# Patient Record
Sex: Male | Born: 1955 | Race: White | Hispanic: No | Marital: Married | Smoking: Never smoker
Health system: Southern US, Community
[De-identification: ages and names within clinical notes are randomized; demographics above are authoritative.]

## PROBLEM LIST (undated history)

## (undated) DIAGNOSIS — J45909 Unspecified asthma, uncomplicated: Secondary | ICD-10-CM

## (undated) DIAGNOSIS — K862 Cyst of pancreas: Secondary | ICD-10-CM

## (undated) DIAGNOSIS — E785 Hyperlipidemia, unspecified: Secondary | ICD-10-CM

## (undated) DIAGNOSIS — Z8249 Family history of ischemic heart disease and other diseases of the circulatory system: Secondary | ICD-10-CM

## (undated) HISTORY — DX: Unspecified asthma, uncomplicated: J45.909

---
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
## 1998-09-03 HISTORY — PX: COLONOSCOPY: SHX174

## 1999-06-02 ENCOUNTER — Ambulatory Visit (HOSPITAL_COMMUNITY): Admission: RE | Admit: 1999-06-02 | Discharge: 1999-06-02 | Payer: Self-pay | Admitting: Gastroenterology

## 2014-06-20 ENCOUNTER — Emergency Department (HOSPITAL_BASED_OUTPATIENT_CLINIC_OR_DEPARTMENT_OTHER)
Admission: EM | Admit: 2014-06-20 | Discharge: 2014-06-20 | Disposition: A | Discharge status: Home / Self Care | Payer: BC Managed Care – PPO | Attending: Emergency Medicine | Admitting: Emergency Medicine

## 2014-06-20 ENCOUNTER — Encounter (HOSPITAL_BASED_OUTPATIENT_CLINIC_OR_DEPARTMENT_OTHER): Payer: Self-pay | Admitting: Emergency Medicine

## 2014-06-20 DIAGNOSIS — W28XXXA Contact with powered lawn mower, initial encounter: Secondary | ICD-10-CM | POA: Insufficient documentation

## 2014-06-20 DIAGNOSIS — Y9389 Activity, other specified: Secondary | ICD-10-CM | POA: Insufficient documentation

## 2014-06-20 DIAGNOSIS — Y9289 Other specified places as the place of occurrence of the external cause: Secondary | ICD-10-CM | POA: Insufficient documentation

## 2014-06-20 DIAGNOSIS — S81821A Laceration with foreign body, right lower leg, initial encounter: Secondary | ICD-10-CM | POA: Insufficient documentation

## 2014-06-20 DIAGNOSIS — Z23 Encounter for immunization: Secondary | ICD-10-CM | POA: Diagnosis not present

## 2014-06-20 DIAGNOSIS — T148XXA Other injury of unspecified body region, initial encounter: Secondary | ICD-10-CM

## 2014-06-20 MED ORDER — SULFAMETHOXAZOLE-TRIMETHOPRIM 800-160 MG PO TABS: 1.0000 | ORAL_TABLET | Freq: Two times a day (BID) | ORAL | Status: AC

## 2014-06-20 MED ORDER — CEPHALEXIN 500 MG PO CAPS: 500.0000 mg | ORAL_CAPSULE | Freq: Four times a day (QID) | ORAL | Status: DC

## 2014-06-20 MED ORDER — LIDOCAINE HCL 2 % IJ SOLN
INTRAMUSCULAR | Status: AC
  Administered 2014-06-20: 20:00:00
  Filled 2014-06-20: qty 20

## 2014-06-20 MED ORDER — TETANUS-DIPHTH-ACELL PERTUSSIS 5-2.5-18.5 LF-MCG/0.5 IM SUSP
0.5000 mL | Freq: Once | INTRAMUSCULAR | Status: AC
  Administered 2014-06-20: 0.5 mL via INTRAMUSCULAR
  Filled 2014-06-20: qty 0.5

## 2014-06-20 NOTE — ED Notes (Signed)
Large splinter in right calf.

## 2014-06-20 NOTE — Discharge Instructions (Signed)
Take both antibiotics to completion. Follow up in 5-7 days for suture removal.  Sliver Removal You have had a sliver (splinter) removed. This has caused a wound that extends through some or all layers of the skin and possibly into the subcutaneous tissue. This is the tissue just beneath the skin. Because these wounds can not be cleaned well, it is necessary to watch closely for infection. AFTER THE PROCEDURE  If a cut (incision) was necessary to remove this, it may have been repaired for you by your caregiver either with suturing, stapling, or adhesive strips. These keep together the skin edges and allow better and faster healing. HOME CARE INSTRUCTIONS   A dressing may have been applied. This may be changed once per day or as instructed. If the dressing sticks, it may be soaked off with a gauze pad or clean cloth that has been dampened with soapy water or hydrogen peroxide.  It is difficult to remove all slivers or foreign bodies as they may break or splinter into smaller pieces. Be aware that your body will work to remove the foreign substance. That is, the foreign body may work itself out of the wound. That is normal.  Watch for signs of infection and notify your caregiver if you suspect a sliver or foreign body remains in the wound.  You may have received a recommendation to follow up with your physician or a specialist. It is very important to call for or keep follow-up appointments in order to avoid infection or other complications.  Only take over-the-counter or prescription medicines for pain, discomfort, or fever as directed by your caregiver.  If antibiotics were prescribed, be sure to finish all of the medicine. If you did not receive a tetanus shot today because you did not recall when your last one was given, check with your caregiver in the next day or two during follow up to determine if one is needed. SEEK MEDICAL CARE IF:   The area around the wound has new or worsening redness  or tenderness.  Pus is coming from the wound  There is a foul smell from the wound or dressing  The edges of a wound that had been repaired break open SEEK IMMEDIATE MEDICAL CARE IF:   Red streaks are coming from the wound  An unexplained oral temperature above 102 F (38.9 C) develops. Document Released: 08/17/2000 Document Revised: 11/12/2011 Document Reviewed: 04/05/2008 [facility] Patient Information 2015 [place], [place]. This information is not intended to replace advice given to you by your health care provider. Make sure you discuss any questions you have with your health care provider.  Wood Splinters Wood splinters need to be removed because they can cause skin irritation and infection. If they are close to the surface, splinters can usually be removed easily. Deep splinters may be hard to locate and need treatment by a surgeon. SPLINTER REMOVAL Removal of splinters by your caregiver is considered a surgical procedure.   The area is carefully cleaned. You may require a small amount of anesthesia (medicine injected near the splinter to numb the tissue and lessen pain). After the splinter is removed, the area will be cleaned again. A bandage is applied.  If your splinter is under a fingernail or toenail, then a small section of the nail may need to be removed. As long as the splinter did not extend to the base of the nail, the nail usually grows back normally.  A splinter that is deeper, more contaminated, or that gets near  a structure such as a bone, nerve or blood vessel may need to be removed by a Psychologist, sport and exercise.  You may need special X-rays or scans if the splinter is hard to locate.  Every attempt is made to remove the entire splinter. However, small particles may remain. Tell your caregiver if you feel that a part of the splinter was left behind. HOME CARE INSTRUCTIONS   Keep the injured area high up (elevated).  Use the injured area as little as possible.  Keep the injured  area clean and dry. Follow any directions from your caregiver.  Keep any follow-up or wound check appointments. You might need a tetanus shot now if:  You have no idea when you had the last one.  You have never had a tetanus shot before.  The injured area had dirt in it. Even if you have already removed the splinter, call your caregiver to get a tetanus shot if you need one.  If you need a tetanus shot, and you decide not to get one, there is a rare chance of getting tetanus. Sickness from tetanus can be serious. If you did get a tetanus shot, your arm may swell, get red and warm to the touch at the shot site. This is common and not a problem. SEEK MEDICAL CARE IF:   A splinter has been removed, but you are not better in a day or two.  You develop a temperature.  Signs of infection develop such as:  Redness, swelling or pus around the wound.  Red streaks spreading back from your wound towards your body. Document Released: 09/27/2004 Document Revised: 01/04/2014 Document Reviewed: 08/30/2008 [facility] Patient Information 2015 [place], [place]. This information is not intended to replace advice given to you by your health care provider. Make sure you discuss any questions you have with your health care provider.

## 2014-06-20 NOTE — ED Provider Notes (Signed)
CSN: [id number]     Arrival date & time 06/20/14  1812 History   First MD Initiated Contact with Patient 06/20/14 [place]     Chief Complaint  Patient presents with  . Foreign Body in Skin     (Consider location/radiation/quality/duration/timing/severity/associated sxs/prior Treatment) HPI Comments: This is a 58 year old male who presents to the emergency department with a splinter on the anterior aspect of his right lower leg. He reports that he was mowing the lawn this evening some wood flew into the air and got into his leg. States he tried to remove the splinter but was unable to do so. States it is only minimally painful. No aggravating or alleviating factors. Last tetanus shot was about 16 years ago.  The history is provided by the patient.    History reviewed. No pertinent past medical history. History reviewed. No pertinent past surgical history. No family history on file. History  Substance Use Topics  . Smoking status: Never Smoker   . Smokeless tobacco: Not on file  . Alcohol Use: No    Review of Systems  Constitutional: Negative.   HENT: Negative.   Gastrointestinal: Negative.   Musculoskeletal: Negative.   Skin: Positive for wound.  Neurological: Negative for numbness.      Allergies  Review of patient's allergies indicates no known allergies.  Home Medications   Prior to Admission medications   Medication Sig Start Date End Date Taking? Authorizing Provider  cephALEXin (KEFLEX) 500 MG capsule Take 1 capsule (500 mg total) by mouth 4 (four) times daily. 06/20/14   Traves Majchrzak M Rahmir Beever, PA-C  sulfamethoxazole-trimethoprim (BACTRIM DS,SEPTRA DS) 800-160 MG per tablet Take 1 tablet by mouth 2 (two) times daily. 06/20/14 06/27/14  Shilah Hefel M Spencer Cardinal, PA-C   BP 168/106  Pulse 86  Temp(Src) 98.3 F (36.8 C) (Oral)  Resp 20  SpO2 100% Physical Exam  Nursing note and vitals reviewed. Constitutional: He is oriented to person, place, and time. He appears well-developed and  well-nourished. No distress.  HENT:  Head: Normocephalic and atraumatic.  Eyes: Conjunctivae and EOM are normal.  Neck: Normal range of motion. Neck supple.  Cardiovascular: Normal rate, regular rhythm and normal heart sounds.   Pulmonary/Chest: Effort normal and breath sounds normal.  Musculoskeletal: Normal range of motion.       Legs: Neurological: He is alert and oriented to person, place, and time.  Skin: Skin is warm and dry.  Psychiatric: He has a normal mood and affect. His behavior is normal.    ED Course  FOREIGN BODY REMOVAL Date/Time: 06/20/2014 7:16 PM Performed by: Carman Ching Authorized by: Carman Ching Consent: Verbal consent obtained. Consent given by: patient Body area: skin General location: lower extremity Location details: right lower leg Anesthesia: local infiltration Local anesthetic: lidocaine 2% without epinephrine Removal mechanism: scalpel and forceps Tendon involvement: none Depth: subcutaneous Complexity: simple 1 objects recovered. Post-procedure assessment: foreign body removed   (including critical care time) LACERATION REPAIR Performed by: Lucien Mons Authorized by: Lucien Mons Consent: Verbal consent obtained. Risks and benefits: risks, benefits and alternatives were discussed Consent given by: patient Patient identity confirmed: provided demographic data Prepped and Draped in normal sterile fashion Wound explored  Laceration Location: right lower leg  Laceration Length: 1cm  No Foreign Bodies seen or palpated  Anesthesia: local infiltration  Local anesthetic: lidocaine 2% without epinephrine  Anesthetic total: 1 ml  Irrigation method: syringe Amount of cleaning: standard  Skin closure: 4-0 prolene  Number of sutures: 1  Technique:  simple interrupted  Patient tolerance: Patient tolerated the procedure well with no immediate complications.  Labs Review Labs Reviewed - No data to display  Imaging Review No  results found.   EKG Interpretation None      MDM   Final diagnoses:  Foreign body in skin   Patient nontoxic appearing and in no apparent distress. Neurovascularly intact. Foreign body removed with scalpel and forceps. One suture closed incision loosely. Tetanus updated. Will start patient on Bactrim and Keflex. Stable for discharge. Return precautions given. Patient states understanding of treatment care plan and is agreeable.  Case discussed with attending Dr. Johnney Killian who agrees with plan of care.   Carman Ching, PA-C 06/20/14 [phone]

## 2014-06-21 NOTE — ED Provider Notes (Signed)
Medical screening examination/treatment/procedure(s) were performed by non-physician practitioner and as supervising physician I was immediately available for consultation/collaboration.   EKG Interpretation None       Charlesetta Shanks, MD 06/21/14 0001

## 2017-11-24 DIAGNOSIS — R55 Syncope and collapse: Secondary | ICD-10-CM | POA: Insufficient documentation

## 2019-12-18 DIAGNOSIS — N401 Enlarged prostate with lower urinary tract symptoms: Secondary | ICD-10-CM | POA: Diagnosis not present

## 2019-12-18 DIAGNOSIS — E785 Hyperlipidemia, unspecified: Secondary | ICD-10-CM | POA: Diagnosis not present

## 2019-12-18 DIAGNOSIS — Z0000 Encounter for general adult medical examination without abnormal findings: Secondary | ICD-10-CM | POA: Diagnosis not present

## 2019-12-18 DIAGNOSIS — R030 Elevated blood-pressure reading, without diagnosis of hypertension: Secondary | ICD-10-CM | POA: Diagnosis not present

## 2019-12-18 DIAGNOSIS — J4520 Mild intermittent asthma, uncomplicated: Secondary | ICD-10-CM | POA: Diagnosis not present

## 2019-12-18 DIAGNOSIS — L989 Disorder of the skin and subcutaneous tissue, unspecified: Secondary | ICD-10-CM | POA: Diagnosis not present

## 2019-12-18 DIAGNOSIS — Z125 Encounter for screening for malignant neoplasm of prostate: Secondary | ICD-10-CM | POA: Diagnosis not present

## 2019-12-25 DIAGNOSIS — Z1211 Encounter for screening for malignant neoplasm of colon: Secondary | ICD-10-CM | POA: Diagnosis not present

## 2019-12-31 DIAGNOSIS — R7309 Other abnormal glucose: Secondary | ICD-10-CM | POA: Diagnosis not present

## 2019-12-31 DIAGNOSIS — R7401 Elevation of levels of liver transaminase levels: Secondary | ICD-10-CM | POA: Diagnosis not present

## 2020-01-08 DIAGNOSIS — H01004 Unspecified blepharitis left upper eyelid: Secondary | ICD-10-CM | POA: Diagnosis not present

## 2020-01-08 DIAGNOSIS — H01001 Unspecified blepharitis right upper eyelid: Secondary | ICD-10-CM | POA: Diagnosis not present

## 2020-01-08 DIAGNOSIS — H04123 Dry eye syndrome of bilateral lacrimal glands: Secondary | ICD-10-CM | POA: Diagnosis not present

## 2020-01-08 DIAGNOSIS — H2513 Age-related nuclear cataract, bilateral: Secondary | ICD-10-CM | POA: Diagnosis not present

## 2020-01-13 DIAGNOSIS — R7989 Other specified abnormal findings of blood chemistry: Secondary | ICD-10-CM | POA: Diagnosis not present

## 2020-01-15 ENCOUNTER — Other Ambulatory Visit: Payer: Self-pay | Admitting: Family Medicine

## 2020-01-15 DIAGNOSIS — K769 Liver disease, unspecified: Secondary | ICD-10-CM

## 2020-01-15 DIAGNOSIS — R932 Abnormal findings on diagnostic imaging of liver and biliary tract: Secondary | ICD-10-CM

## 2020-01-16 ENCOUNTER — Other Ambulatory Visit: Payer: Self-pay

## 2020-01-16 ENCOUNTER — Ambulatory Visit
Admission: RE | Admit: 2020-01-16 | Discharge: 2020-01-16 | Disposition: A | Discharge status: Home / Self Care | Payer: Medicare Other | Source: Ambulatory Visit | Attending: Family Medicine | Admitting: Family Medicine

## 2020-01-16 DIAGNOSIS — R932 Abnormal findings on diagnostic imaging of liver and biliary tract: Secondary | ICD-10-CM

## 2020-01-16 DIAGNOSIS — K769 Liver disease, unspecified: Secondary | ICD-10-CM

## 2020-01-16 IMAGING — MR MR ABDOMEN WO/W CM
17 series · 48 of 48 positions shown · IV contrast (multihance)
Comparison: None.

CLINICAL DATA: Evaluate liver lesion identified on recent
ultrasound. Elevated liver function test.

EXAM:
MRI ABDOMEN WITHOUT AND WITH CONTRAST
TECHNIQUE: Multiplanar multisequence MR imaging of the abdomen was performed
both before and after the administration of intravenous contrast.
CONTRAST:  17mL MULTIHANCE GADOBENATE DIMEGLUMINE 529 MG/ML IV SOLN

[Series 4: T2 · coronal · 5.0mm · 1.56mm/px · 1 of 36 slices shown (1 of 3)]
[im 1/36]
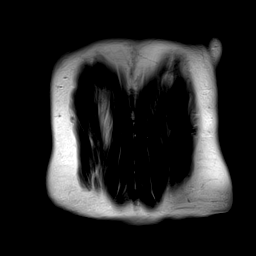

[Series 5: T1 · axial · 3.0mm · 1.19mm/px · z∈[-165,+48]mm · 5 of 144 slices shown]
[im 1/144]
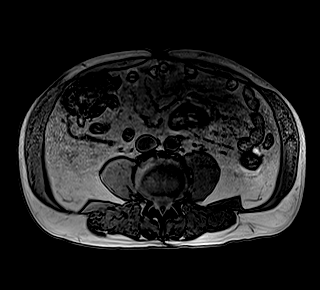
[im 36/144]
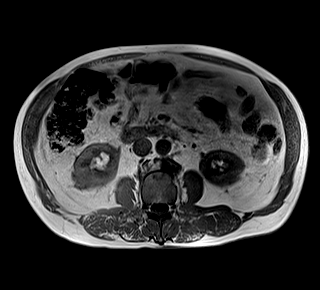
[im 72/144]
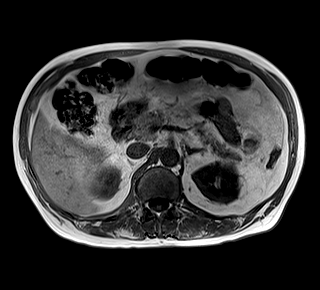
[im 108/144]
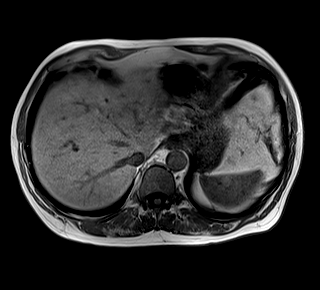
[im 144/144]
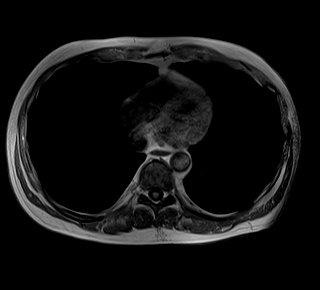

[Series 6: T2 · axial · 5.0mm · 1.48mm/px · z∈[-119,+103]mm · 2 of 38 slices shown (2 of 3)]
[im 1/38]
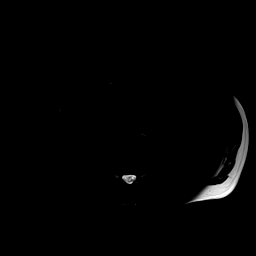
[im 38/38]
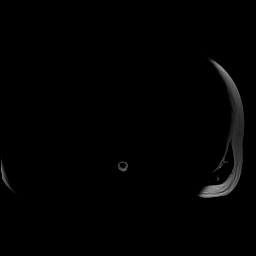

[Series 7: DWI · axial · 5.0mm · 1.42mm/px · z∈[-113,+97]mm · 5 of 108 slices shown (1 of 2)]
[im 1/108]
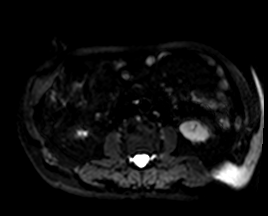
[im 27/108]
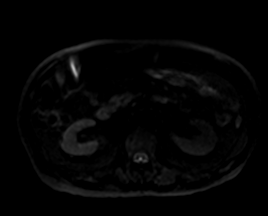
[im 54/108]
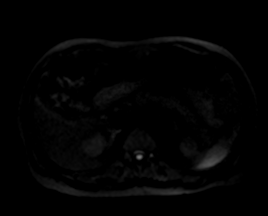
[im 81/108]
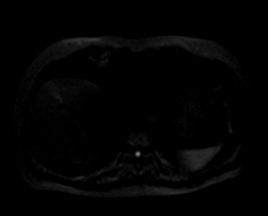
[im 108/108]
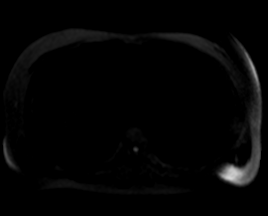

[Series 8: DWI · axial · 5.0mm · 1.42mm/px · z∈[-113,+97]mm · 2 of 36 slices shown (2 of 2)]
[im 1/36]
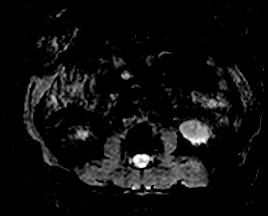
[im 36/36]
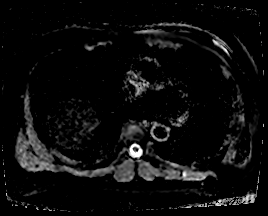

[Series 9: T2 · axial · 6.0mm · 1.19mm/px · 1 of 30 slices shown (3 of 3)]
[im 1/30]
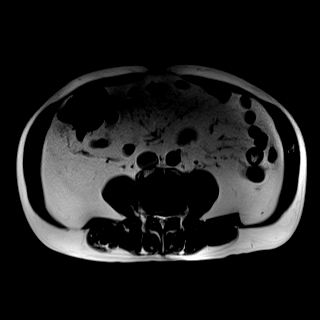

[Series 10: bSSFP · axial · 5.0mm · 1.25mm/px · z∈[-169,+53]mm · 2 of 38 slices shown]
[im 1/38]
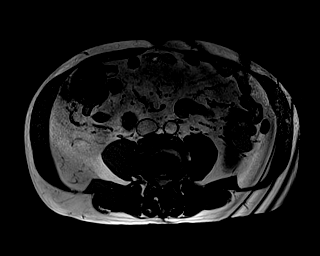
[im 38/38]
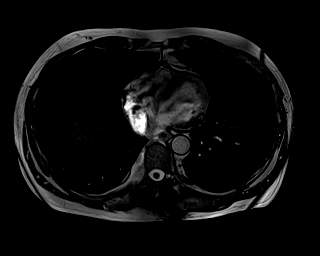

[Series 11: T1 dynamic · axial · non-contrast · 3.0mm · 1.25mm/px · z∈[-165,+48]mm · 3 of 72 slices shown]
[im 1/72]
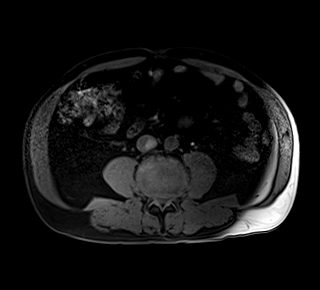
[im 36/72]
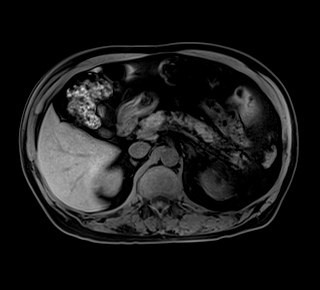
[im 72/72]
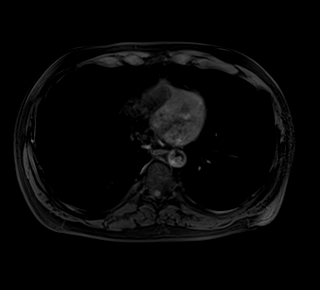

[Series 12: T1 dynamic post-contrast · axial · 3.0mm · 1.25mm/px · z∈[-165,+48]mm · 3 of 72 slices shown (1 of 9)]
[im 1/72]
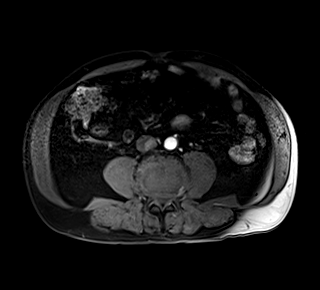
[im 36/72]
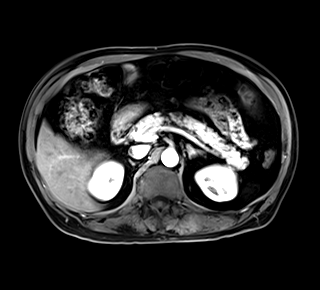
[im 72/72]
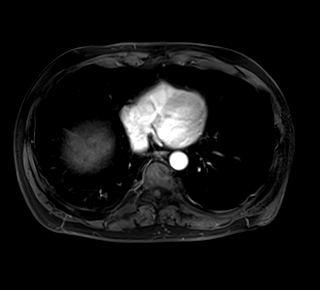

[Series 13: T1 dynamic post-contrast · axial · 3.0mm · 1.25mm/px · z∈[-165,+48]mm · 3 of 72 slices shown (2 of 9)]
[im 1/72]
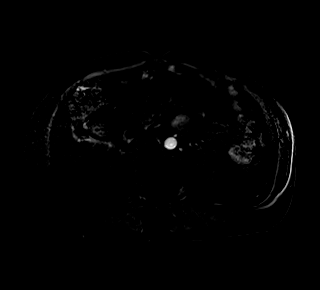
[im 36/72]
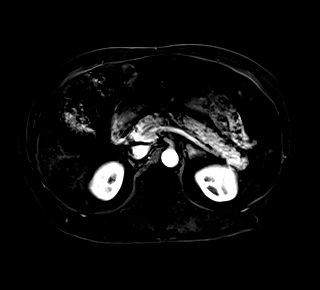
[im 72/72]
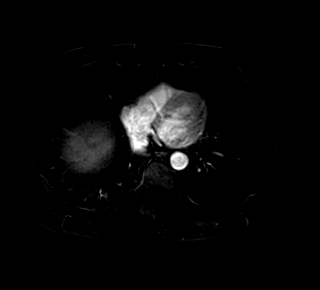

[Series 14: T1 dynamic post-contrast · axial · 3.0mm · 1.25mm/px · z∈[-165,+48]mm · 3 of 72 slices shown (3 of 9)]
[im 1/72]
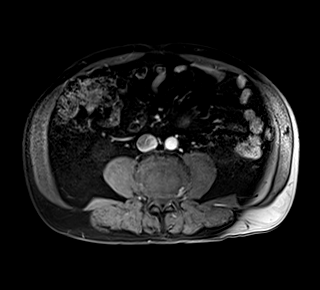
[im 36/72]
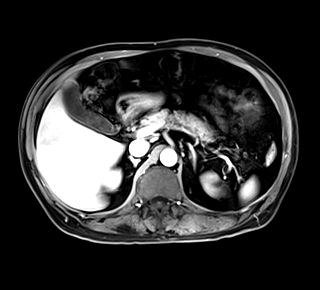
[im 72/72]
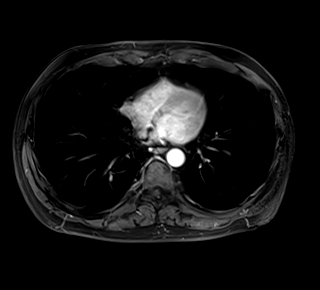

[Series 15: T1 dynamic post-contrast · axial · 3.0mm · 1.25mm/px · z∈[-165,+48]mm · 3 of 72 slices shown (4 of 9)]
[im 1/72]
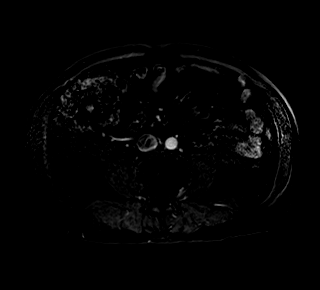
[im 36/72]
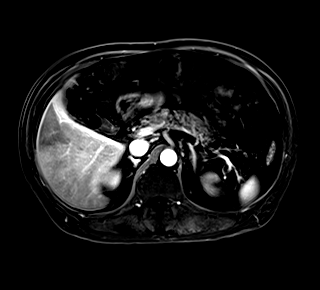
[im 72/72]
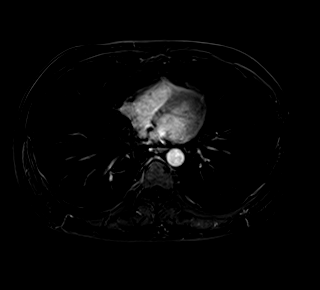

[Series 16: T1 dynamic post-contrast · axial · 3.0mm · 1.25mm/px · z∈[-165,+48]mm · 3 of 72 slices shown (5 of 9)]
[im 1/72]
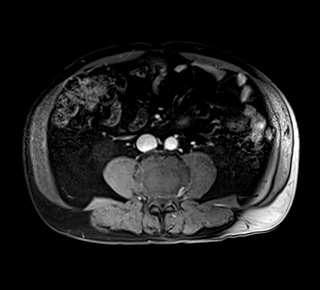
[im 36/72]
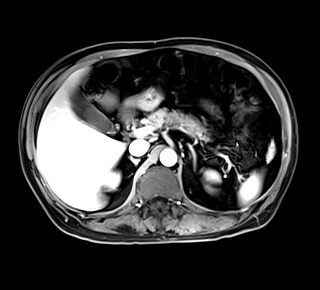
[im 72/72]
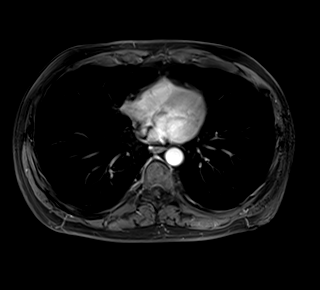

[Series 17: T1 dynamic post-contrast · axial · 3.0mm · 1.25mm/px · z∈[-165,+48]mm · 3 of 72 slices shown (6 of 9)]
[im 1/72]
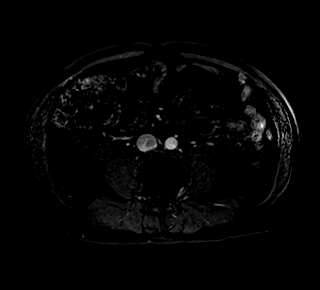
[im 36/72]
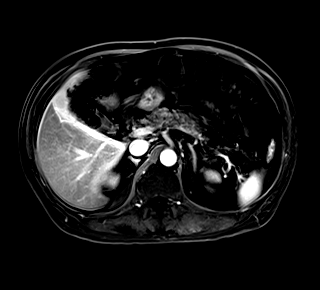
[im 72/72]
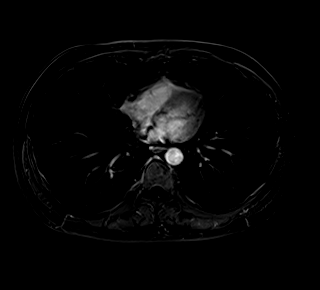

[Series 18: T1 dynamic post-contrast · coronal · 3.0mm · 1.25mm/px · 3 of 72 slices shown (7 of 9)]
[im 1/72]
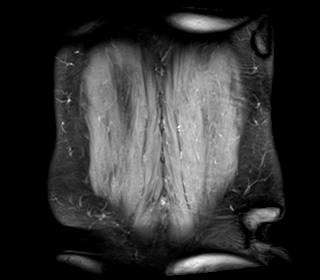
[im 36/72]
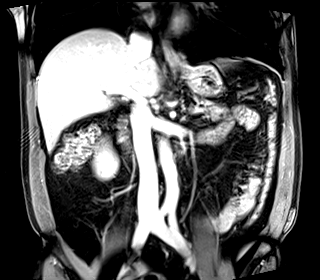
[im 72/72]
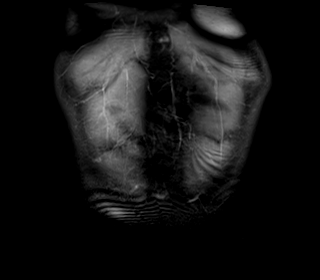

[Series 19: T1 dynamic post-contrast · axial · 3.0mm · 1.25mm/px · z∈[-165,+48]mm · 3 of 72 slices shown (8 of 9)]
[im 1/72]
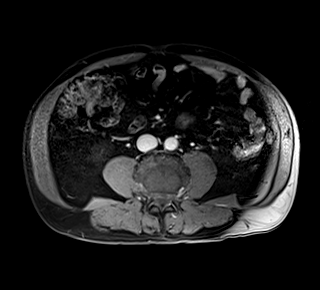
[im 36/72]
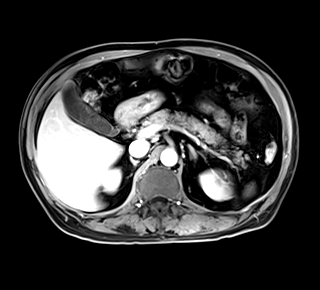
[im 72/72]
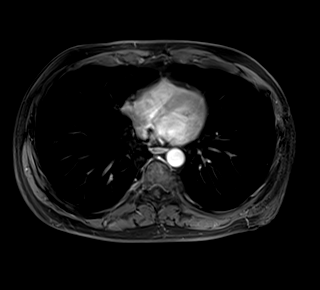

[Series 20: T1 dynamic post-contrast · axial · 3.0mm · 1.25mm/px · z∈[-165,+48]mm · 3 of 72 slices shown (9 of 9)]
[im 1/72]
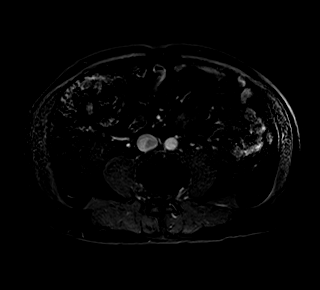
[im 36/72]
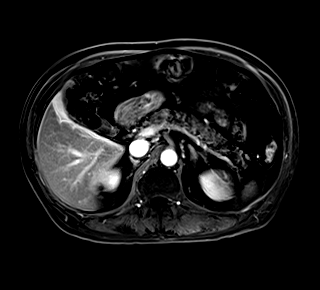
[im 72/72]
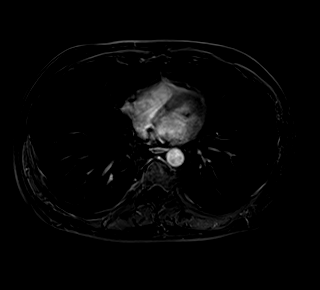

[48 of 48 positions shown; findings below may reference images not displayed]

FINDINGS: Lower chest: No acute findings.

Hepatobiliary: Hepatic steatosis. No suspicious liver lesion. The
gallbladder appears normal. No gallstones, gallbladder wall
thickening or inflammation. No bile duct dilatation.

Pancreas: No main duct dilatation or inflammation noted. Tiny, 5 mm
unilocular cystic structure identified within the pancreatic body,
image [DATE].

Spleen:  Within normal limits in size and appearance.

Adrenals/Urinary Tract: Normal appearance of the adrenal glands. No
kidney mass or hydronephrosis identified.

Stomach/Bowel: Visualized portions within the abdomen are
unremarkable.

Vascular/Lymphatic: No pathologically enlarged lymph nodes
identified. No abdominal aortic aneurysm demonstrated.

Other:  None.

Musculoskeletal: No suspicious bone lesions identified.
IMPRESSION: 1. Hepatic steatosis.
2. No suspicious liver lesions identified.
3. Tiny cystic lesion noted within body of pancreas measures 5 mm.
This has a nonaggressive appearance. Follow-up imaging in 24 months
with pancreas protocol abdominal MRI without and with contrast
material is recommended. This recommendation follows ACR consensus
guidelines: Management of Incidental Pancreatic Cysts: A White Paper
of the ACR Incidental Findings Committee. [HOSPITAL]

## 2020-01-16 MED ORDER — GADOBENATE DIMEGLUMINE 529 MG/ML IV SOLN
17.0000 mL | Freq: Once | INTRAVENOUS | Status: AC | PRN
  Administered 2020-01-16: 17 mL via INTRAVENOUS

## 2022-01-05 ENCOUNTER — Other Ambulatory Visit: Payer: Self-pay | Admitting: Family Medicine

## 2022-01-05 DIAGNOSIS — K869 Disease of pancreas, unspecified: Secondary | ICD-10-CM

## 2022-01-05 DIAGNOSIS — E785 Hyperlipidemia, unspecified: Secondary | ICD-10-CM

## 2022-01-13 ENCOUNTER — Ambulatory Visit
Admission: RE | Admit: 2022-01-13 | Discharge: 2022-01-13 | Disposition: A | Discharge status: Home / Self Care | Payer: Medicare Other | Source: Ambulatory Visit | Attending: Family Medicine | Admitting: Family Medicine

## 2022-01-13 DIAGNOSIS — K869 Disease of pancreas, unspecified: Secondary | ICD-10-CM

## 2022-01-13 IMAGING — MR MR ABDOMEN WO/W CM
14 of 23 series · 26 of 48 positions shown · IV contrast (multihance)
Comparison: Abdominal MRI [DATE].

CLINICAL DATA: 66-year-old male with history of pancreatic lesion.
Follow-up study.

EXAM:
MRI ABDOMEN WITHOUT AND WITH CONTRAST
TECHNIQUE: Multiplanar multisequence MR imaging of the abdomen was performed
both before and after the administration of intravenous contrast.
CONTRAST:  16mL MULTIHANCE GADOBENATE DIMEGLUMINE 529 MG/ML IV SOLN

[Series 3: T2 · coronal · 5.0mm · 1.56mm/px · 1 of 37 slices shown (1 of 5)]
[im 1/37]
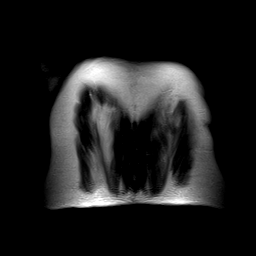

[Series 4: T2 · axial · 6.0mm · 1.48mm/px · 1 of 42 slices shown (2 of 5)]
[im 1/42]
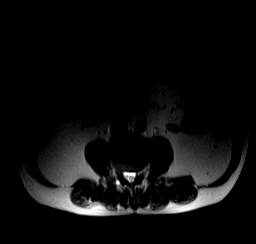

[Series 6: axial in out · axial · 5.5mm · 0.74mm/px · z∈[-167,+130]mm · 2 of 92 slices shown]
[im 1/92]
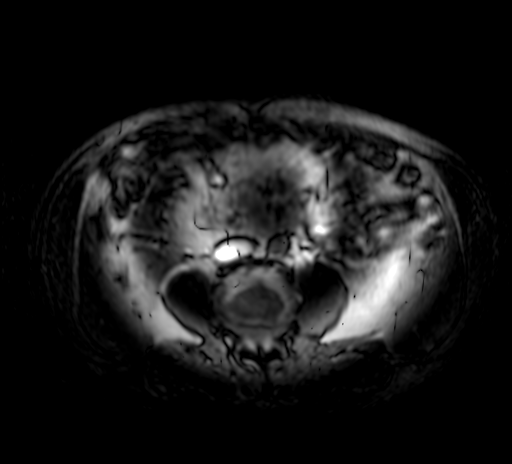
[im 92/92]
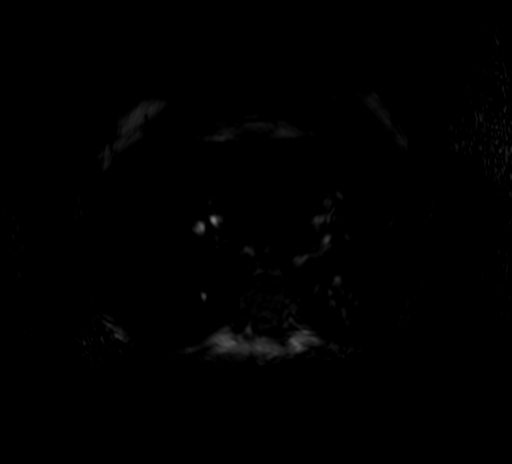

[Series 7: axial tru fisp · axial · 5.0mm · 1.48mm/px · 1 of 48 slices shown]
[im 1/48]
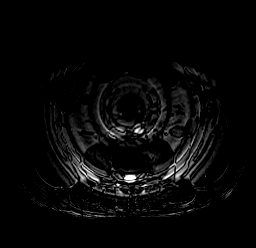

[Series 11: T2 · coronal · 3.5mm · 1.48mm/px · 1 of 55 slices shown (3 of 5)]
[im 1/55]
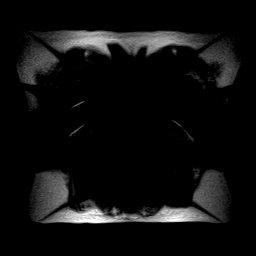

[Series 12: T2 · axial · 6.0mm · 0.78mm/px · 1 of 43 slices shown (4 of 5)]
[im 1/43]
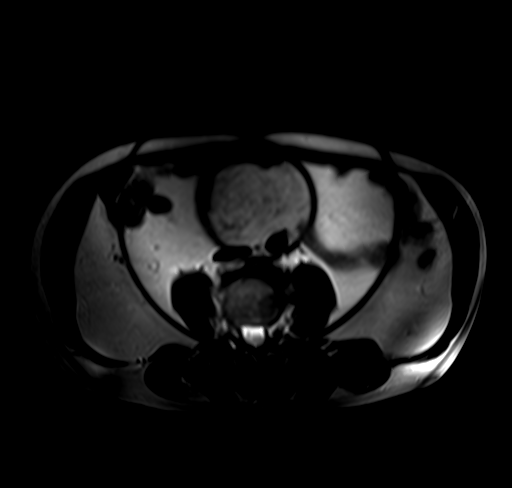

[Series 13: ep2d_[id number]_500_800_p2 · axial · 6.0mm · 1.98mm/px · z∈[-148,+142]mm · 3 of 129 slices shown]
[im 1/129]
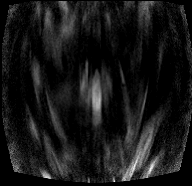
[im 65/129]
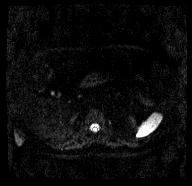
[im 129/129]
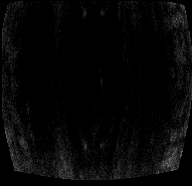

[Series 14: [id number]_[id number]_adc · axial · 6.0mm · 1.98mm/px · 1 of 43 slices shown]
[im 1/43]
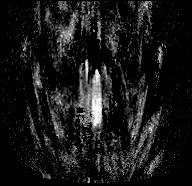

[Series 17: T2 · coronal · 3.0mm · 1.48mm/px · 1 of 39 slices shown (5 of 5)]
[im 1/39]
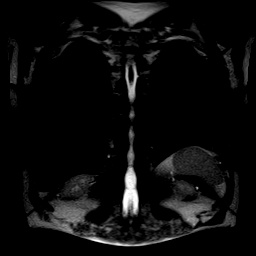

[Series 19: T1 dynamic · axial · non-contrast · 3.0mm · 0.74mm/px · z∈[-167,+118]mm · 2 of 96 slices shown]
[im 1/96]
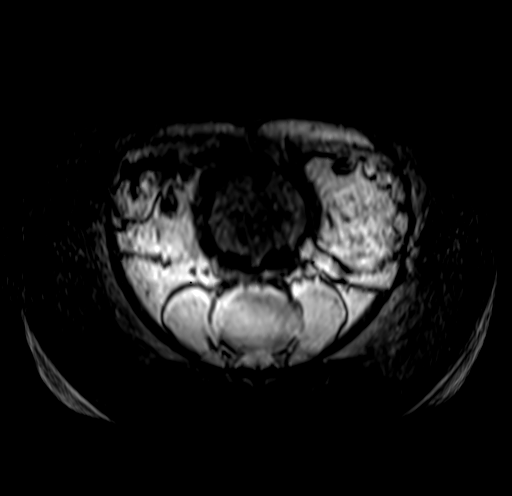
[im 96/96]
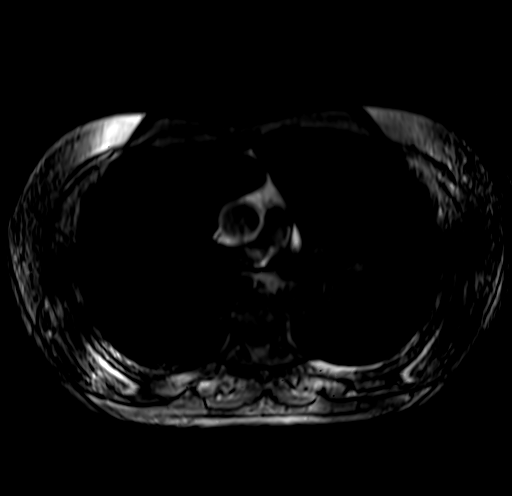

[Series 20: post 30 sec · axial · 3.0mm · 0.74mm/px · z∈[-167,+118]mm · 3 of 96 slices shown]
[im 1/96]
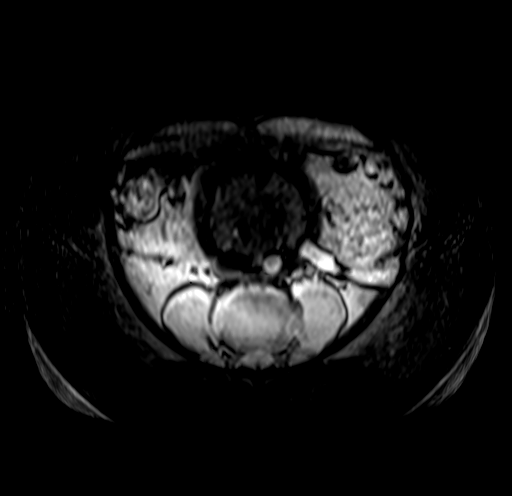
[im 48/96]
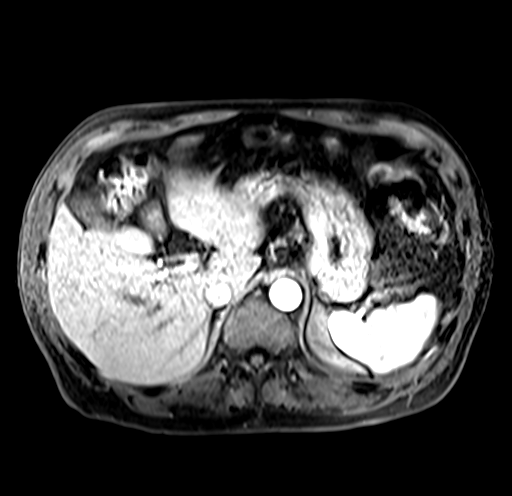
[im 96/96]
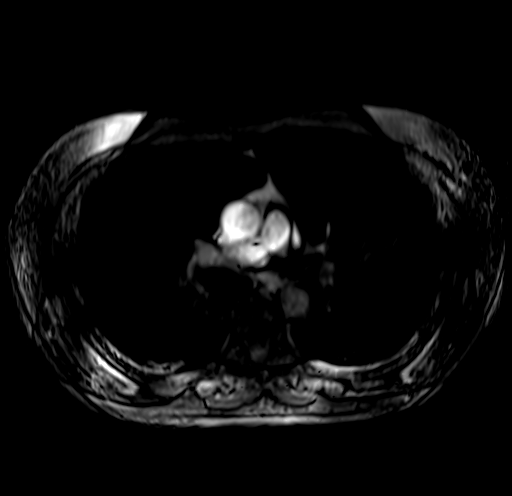

[Series 21: post 30 sec_sub · axial · 3.0mm · 0.74mm/px · z∈[-167,+118]mm · 3 of 96 slices shown]
[im 1/96]
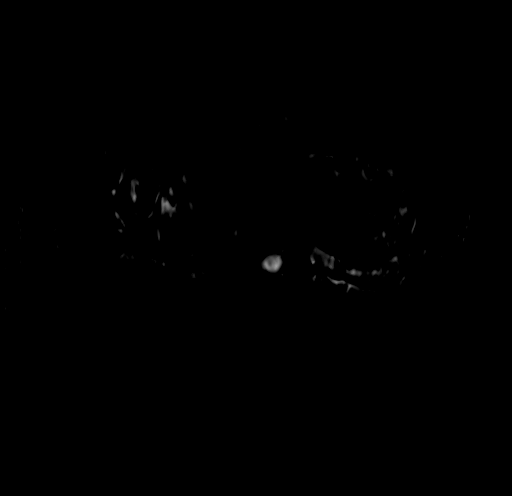
[im 48/96]
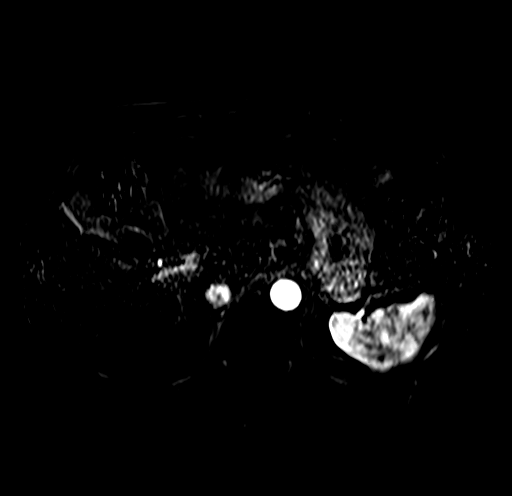
[im 96/96]
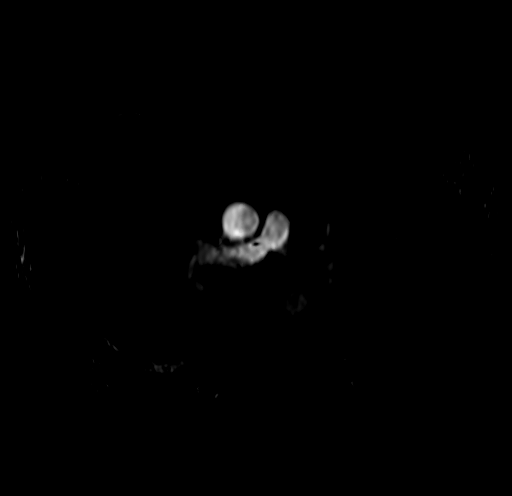

[Series 22: post 60 sec · axial · 3.0mm · 0.74mm/px · z∈[-167,+118]mm · 3 of 96 slices shown]
[im 1/96]
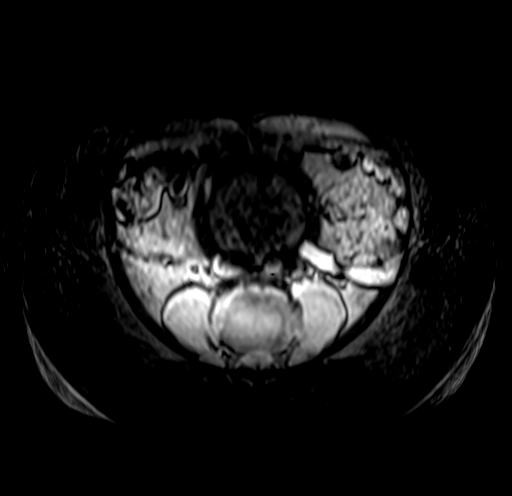
[im 48/96]
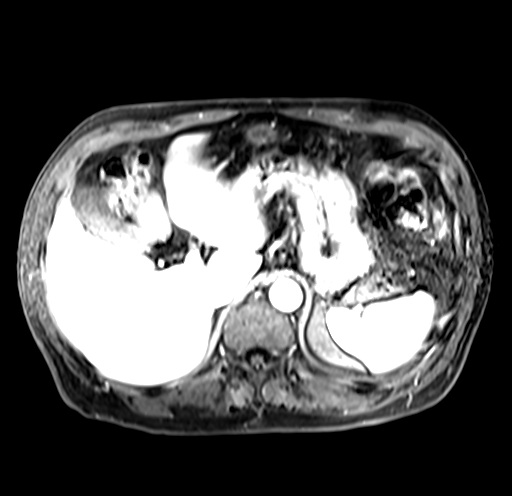
[im 96/96]
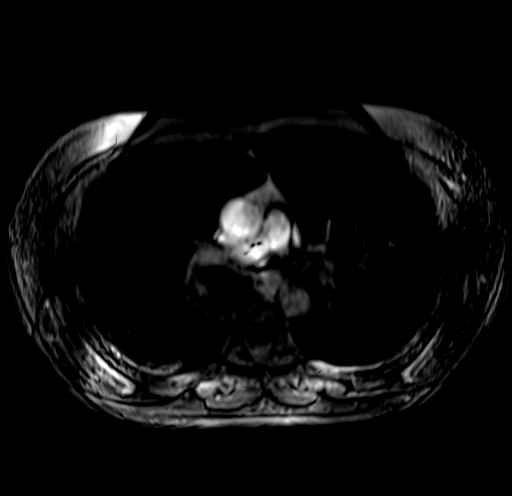

[Series 23: post 60 sec_sub · axial · 3.0mm · 0.74mm/px · z∈[-167,+118]mm · 3 of 96 slices shown]
[im 1/96]
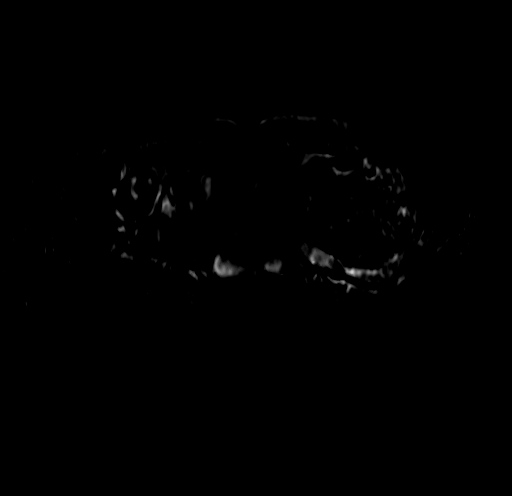
[im 48/96]
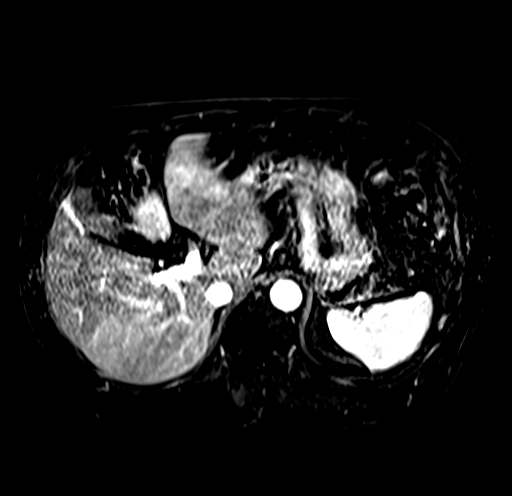
[im 96/96]
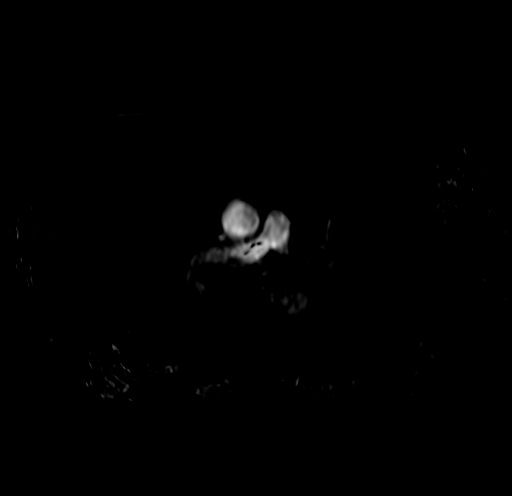

[26 of 48 positions shown; findings below may reference images not displayed]

FINDINGS: Lower chest: Unremarkable.

Hepatobiliary: No suspicious cystic or solid hepatic lesions. No
intra or extrahepatic biliary ductal dilatation. There is some
amorphous T1 hyperintense material which is slightly T2 hypointense
lying dependently in the gallbladder, compatible with a small amount
of biliary sludge. Gallbladder is not distended. Gallbladder wall
thickness is normal. No pericholecystic fluid or surrounding
inflammatory changes.

Pancreas: The previously described small lesion in the mid body of
the pancreas is slightly larger than the prior study (axial image 26
of series 4, and coronal image 31 of series 11)) measuring 8 x 5 x
15 mm on today's examination, but remains T1 hypointense and T2
hyperintense without definite internal enhancement on post
gadolinium imaging. This comes in close proximity to the main
pancreatic duct on MRCP images such that direct communication is not
excluded. Main pancreatic duct is normal in caliber. No other solid
appearing pancreatic mass identified. No peripancreatic fluid
collections or inflammatory changes.

Spleen:  Unremarkable.

Adrenals/Urinary Tract: Bilateral kidneys and adrenal glands are
normal in appearance. No hydroureteronephrosis in the visualized
portions of the abdomen.

Stomach/Bowel: Visualized portions are unremarkable.

Vascular/Lymphatic: No aneurysm identified in the visualized
abdominal vasculature. No lymphadenopathy noted in the abdomen.

Other: No significant volume of ascites noted in the visualized
portions of the peritoneal cavity.

Musculoskeletal: No aggressive appearing osseous lesions are noted
in the visualized portions of the skeleton.
IMPRESSION: 1. The lesion of concern in the mid body of the pancreas has grown
compared to the prior study, and may communicate with the main
pancreatic duct. Findings are suspicious for small side branch IPMN
(intraductal papillary mucinous neoplasm). Repeat abdominal MRI with
and without IV gadolinium with MRCP is recommended in 6 months to
re-evaluate this finding. This recommendation follows ACR consensus
guidelines: Management of Incidental Pancreatic Cysts: A White Paper
of the ACR Incidental Findings Committee. [HOSPITAL]

## 2022-01-13 MED ORDER — GADOBENATE DIMEGLUMINE 529 MG/ML IV SOLN
16.0000 mL | Freq: Once | INTRAVENOUS | Status: AC | PRN
  Administered 2022-01-13: 16 mL via INTRAVENOUS

## 2022-02-01 ENCOUNTER — Ambulatory Visit
Admission: RE | Admit: 2022-02-01 | Discharge: 2022-02-01 | Disposition: A | Discharge status: Home / Self Care | Payer: No Typology Code available for payment source | Source: Ambulatory Visit | Attending: Family Medicine | Admitting: Family Medicine

## 2022-02-01 DIAGNOSIS — E785 Hyperlipidemia, unspecified: Secondary | ICD-10-CM

## 2022-02-01 IMAGING — CT CT CARDIAC CORONARY ARTERY CALCIUM SCORE
3 series · 14 of 20 positions shown, 16 images · non-contrast
Comparison: None Available.

CLINICAL DATA: Hyperlipidemia

EXAM:
CT CARDIAC CORONARY ARTERY CALCIUM SCORE
TECHNIQUE: Non-contrast imaging through the heart was performed using
prospective ECG gating. Image post processing was performed on an
independent workstation, allowing for quantitative analysis of the
heart and coronary arteries. Note that this exam targets the heart
and the chest was not imaged in its entirety.

[Series 2: calcium scoring 2.00 qr36 bestdiast 70% hrt calciu · axial · 0.36mm/px · z∈[+1518,+1620]mm · 4 of 86 slices shown]
[im 18/86  vessel]
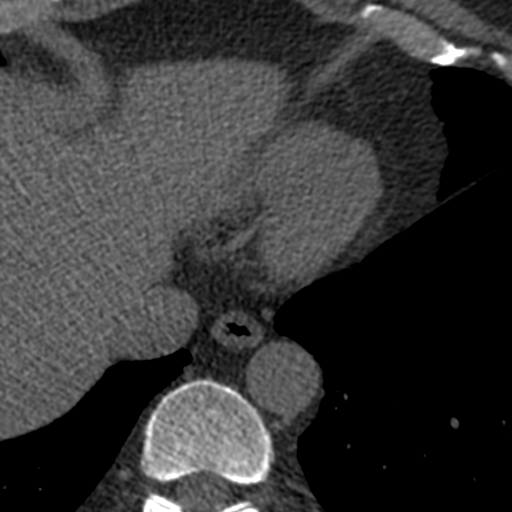
[im 35/86  vessel]
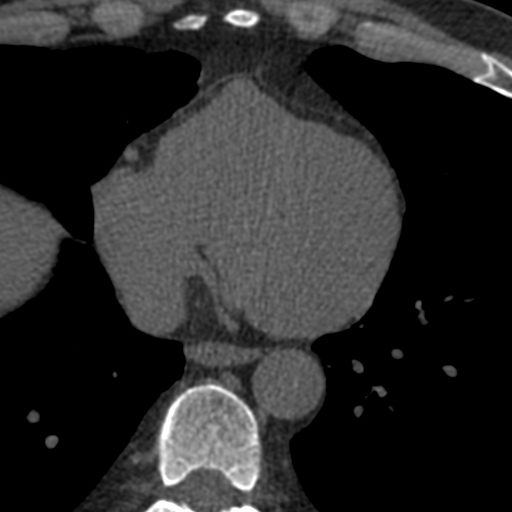
[im 52/86  vessel]
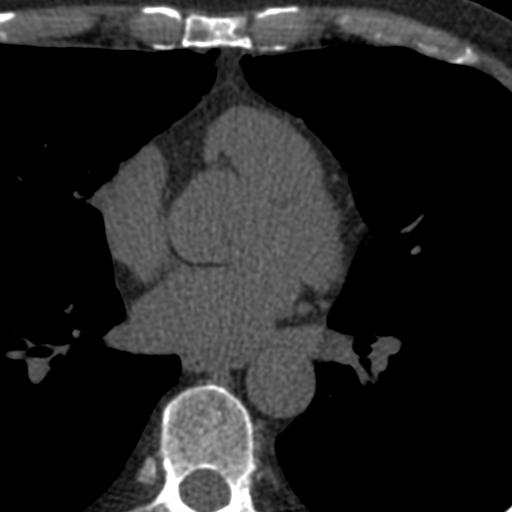
[im 69/86  vessel]
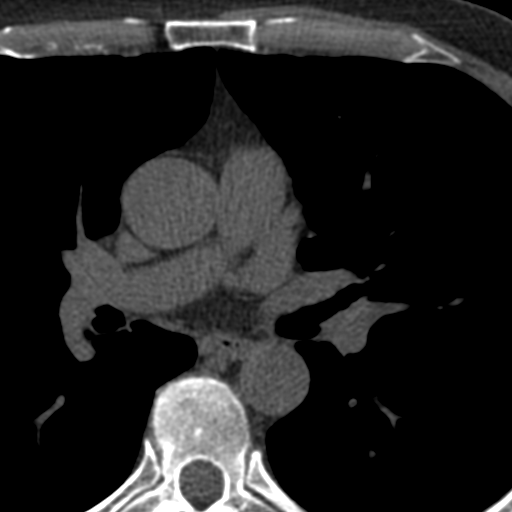

[Series 3: calcium scoring 2.00 br40 bestdiast 70% axial · axial · 0.57mm/px · z∈[+1512,+1624]mm · 5 of 86 slices shown, 7 images]
[im 15/86  vessel]
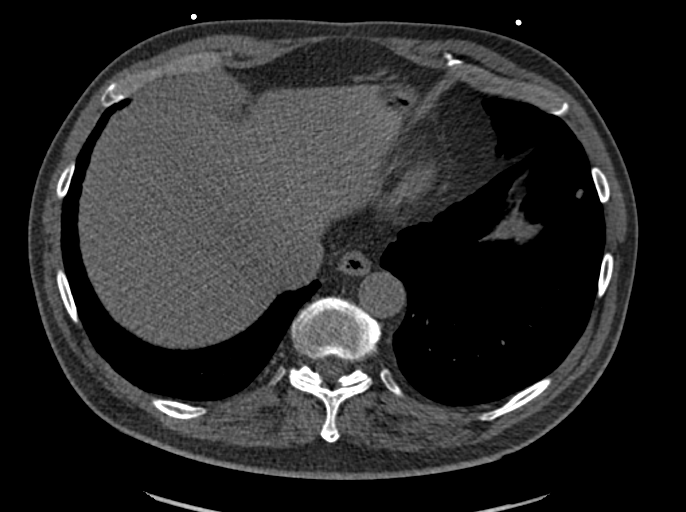
[im 15/86  lung]
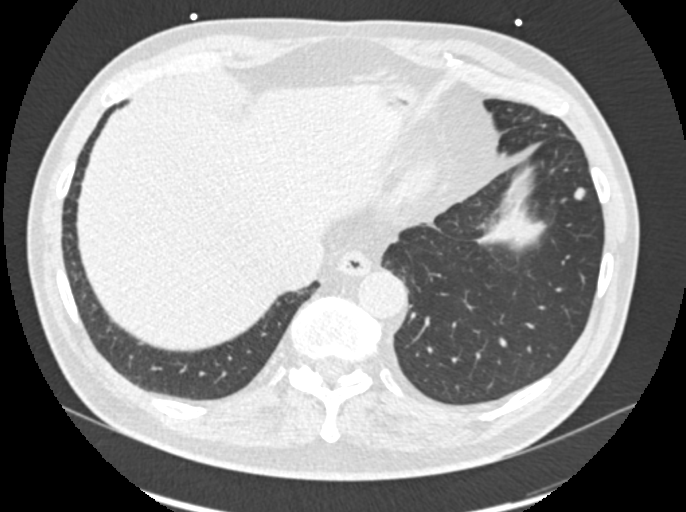
[im 29/86  vessel]
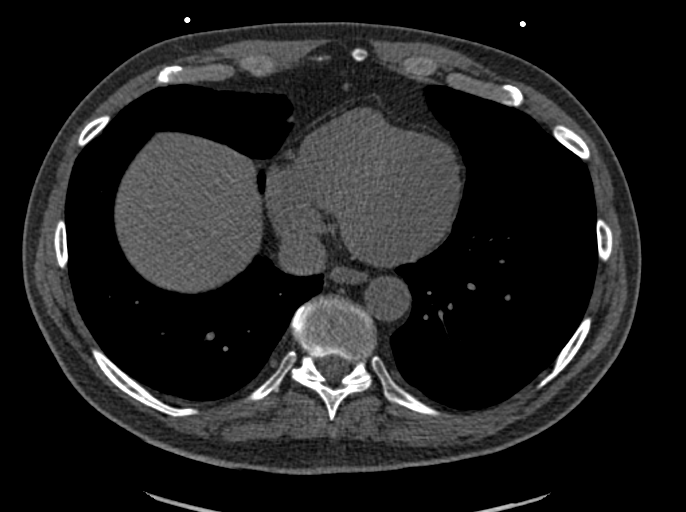
[im 43/86  vessel]
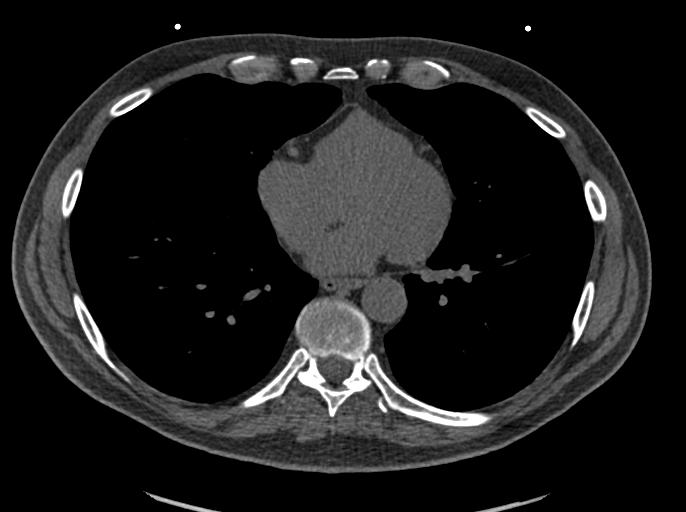
[im 57/86  vessel]
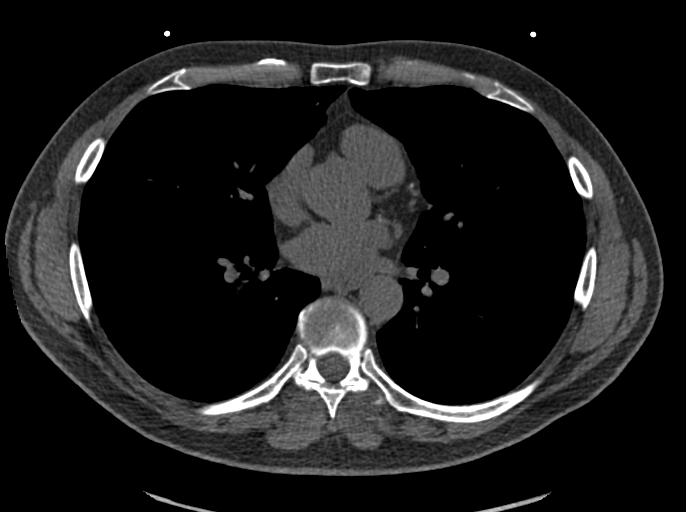
[im 71/86  vessel]
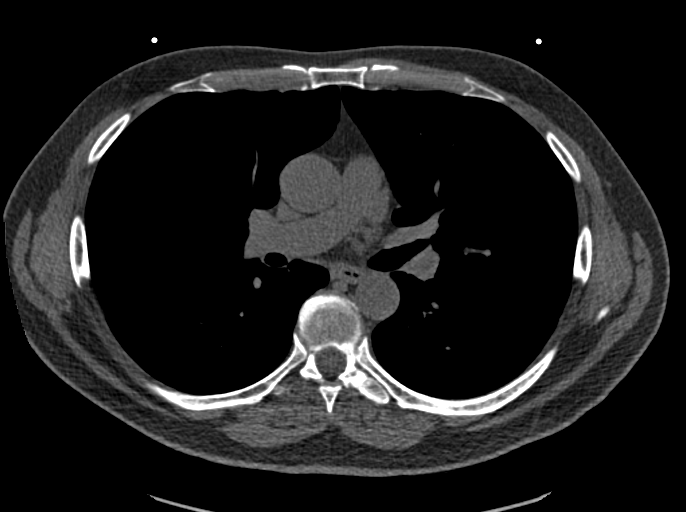
[im 71/86  lung]
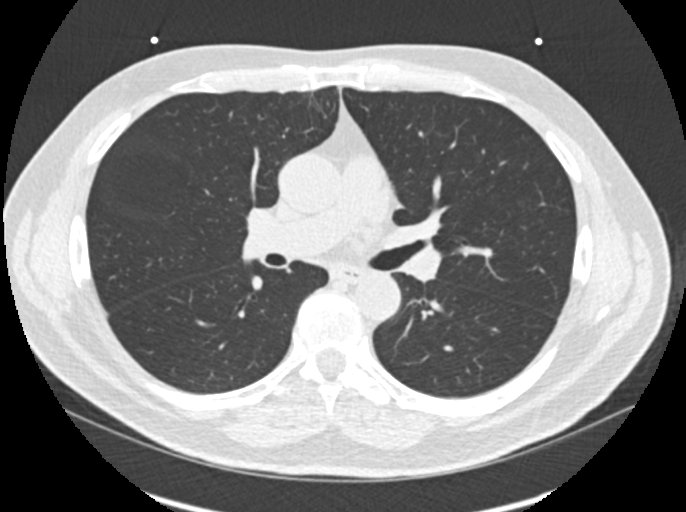

[Series 9: calcium scoring 2.00 br60 bestdiast 70% lungs · axial · 0.57mm/px · z∈[+1512,+1624]mm · 5 of 86 slices shown]
[im 15/86  vessel]
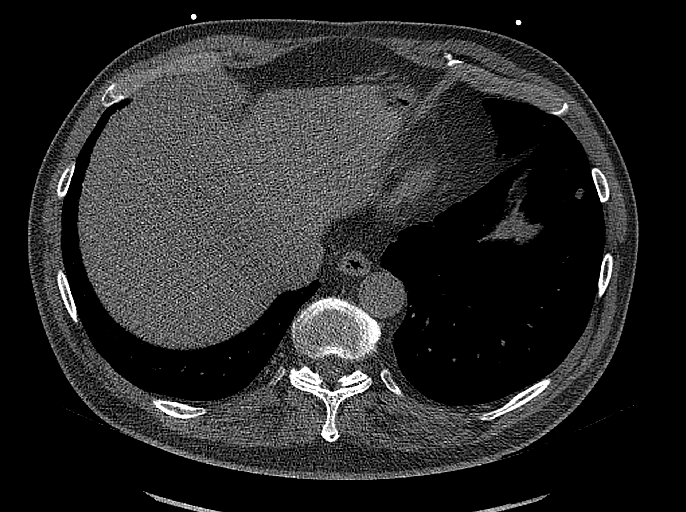
[im 29/86  vessel]
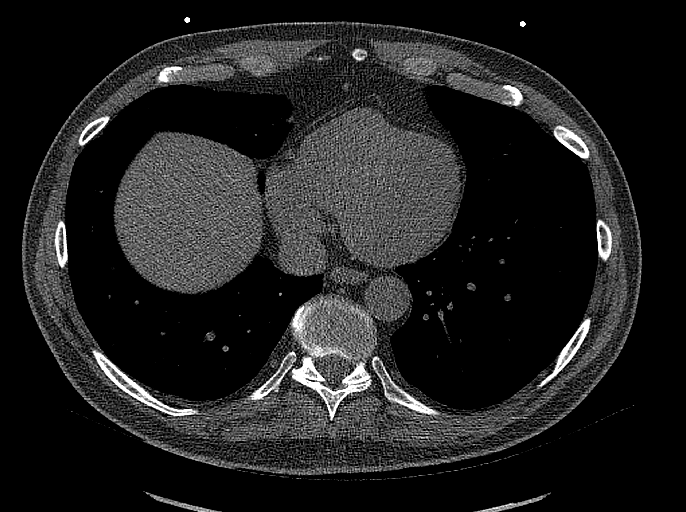
[im 43/86  vessel]
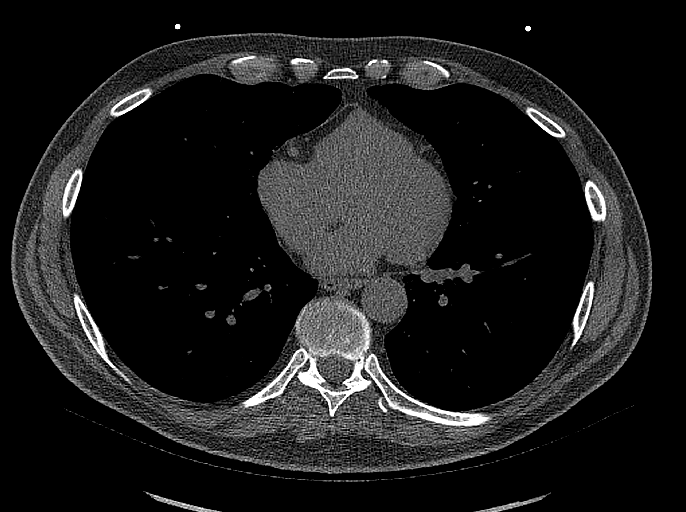
[im 57/86  vessel]
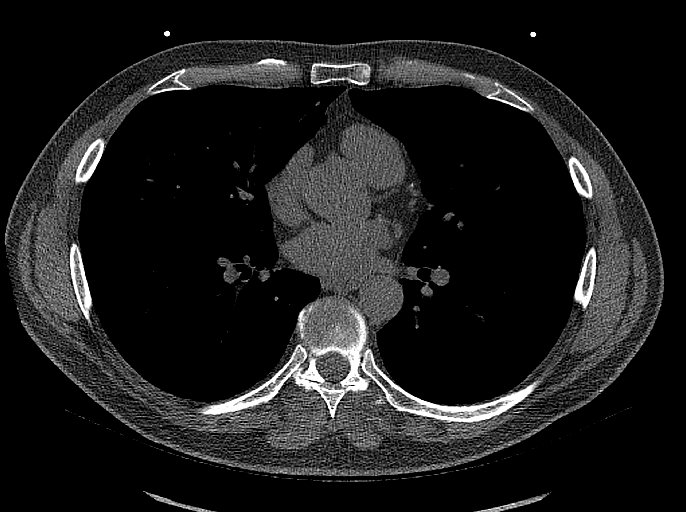
[im 71/86  vessel]
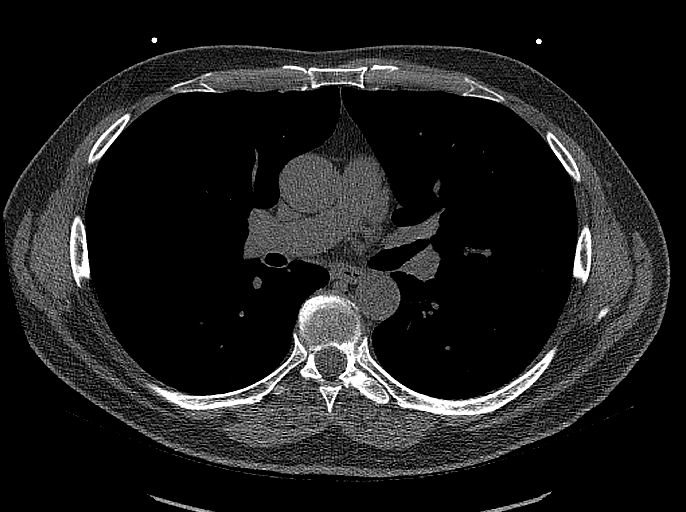

[14 of 20 positions shown; findings below may reference images not displayed]

FINDINGS: CORONARY CALCIUM SCORES:

Left Main: 0

LAD: 326

LCx:

RCA: 141

Total Agatston Score: 512

[HOSPITAL] percentile: 79

AORTA MEASUREMENTS:

Ascending Aorta: 36 mm

Descending Aorta: 24 mm

OTHER FINDINGS:

Heart is normal size. Aorta normal caliber. No adenopathy. No
confluent opacities or effusions. No acute findings in the upper
abdomen. Chest wall soft tissues are unremarkable. No acute bony
abnormality.
IMPRESSION: Total Agatston score: 512

[HOSPITAL] percentile: 79

No acute or significant extracardiac abnormality.

## 2022-02-23 ENCOUNTER — Encounter: Payer: Self-pay | Admitting: Gastroenterology

## 2022-02-27 ENCOUNTER — Encounter: Payer: Self-pay | Admitting: *Deleted

## 2022-03-16 ENCOUNTER — Ambulatory Visit (AMBULATORY_SURGERY_CENTER): Payer: Self-pay | Admitting: *Deleted

## 2022-03-16 VITALS — Ht 72.0 in | Wt 180.0 lb

## 2022-03-16 DIAGNOSIS — Z1211 Encounter for screening for malignant neoplasm of colon: Secondary | ICD-10-CM

## 2022-03-16 MED ORDER — NA SULFATE-K SULFATE-MG SULF 17.5-3.13-1.6 GM/177ML PO SOLN: 1.0000 | ORAL | 0 refills | Status: DC

## 2022-03-16 NOTE — Progress Notes (Signed)
Patient is here in-person for PV. Patient denies any allergies to eggs or soy. Patient denies any problems with anesthesia/sedation. Patient is not on any oxygen at home. Patient is not taking any diet/weight loss medications or blood thinners. Went over procedure prep instructions with the patient. Patient is aware of our care-partner policy. Patient notified to use Singlecare or Good-Rx for prescription.

## 2022-04-13 ENCOUNTER — Encounter: Payer: Self-pay | Admitting: Gastroenterology

## 2022-04-13 ENCOUNTER — Ambulatory Visit (AMBULATORY_SURGERY_CENTER): Payer: Medicare Other | Admitting: Gastroenterology

## 2022-04-13 VITALS — BP 125/90 | HR 67 | Temp 97.8°F | Resp 15 | Ht 72.0 in | Wt 180.0 lb

## 2022-04-13 DIAGNOSIS — D123 Benign neoplasm of transverse colon: Secondary | ICD-10-CM

## 2022-04-13 DIAGNOSIS — D120 Benign neoplasm of cecum: Secondary | ICD-10-CM

## 2022-04-13 DIAGNOSIS — D122 Benign neoplasm of ascending colon: Secondary | ICD-10-CM

## 2022-04-13 DIAGNOSIS — Z1211 Encounter for screening for malignant neoplasm of colon: Secondary | ICD-10-CM

## 2022-04-13 DIAGNOSIS — D125 Benign neoplasm of sigmoid colon: Secondary | ICD-10-CM

## 2022-04-13 MED ORDER — SODIUM CHLORIDE 0.9 % IV SOLN: 500.0000 mL | Freq: Once | INTRAVENOUS | Status: DC

## 2022-04-13 NOTE — Op Note (Signed)
[facility] Patient Name: Hawkins Seaman Procedure Date: 04/13/2022 9:28 AM MRN: [id number] Endoscopist: Jackquline Denmark , MD Age: 66 Referring MD:  Date of Birth: 02-08-1956 Gender: Male Account #: [id number] Procedure:                Colonoscopy Indications:              Screening for colorectal malignant neoplasm Medicines:                Monitored Anesthesia Care Procedure:                Pre-Anesthesia Assessment:                           - Prior to the procedure, a History and Physical                            was performed, and patient medications and                            allergies were reviewed. The patient's tolerance of                            previous anesthesia was also reviewed. The risks                            and benefits of the procedure and the sedation                            options and risks were discussed with the patient.                            All questions were answered, and informed consent                            was obtained. Prior Anticoagulants: The patient has                            taken no previous anticoagulant or antiplatelet                            agents. ASA Grade Assessment: II - A patient with                            mild systemic disease. After reviewing the risks                            and benefits, the patient was deemed in                            satisfactory condition to undergo the procedure.                           After obtaining informed consent, the colonoscope  was passed under direct vision. Throughout the                            procedure, the patient's blood pressure, pulse, and                            oxygen saturations were monitored continuously. The                            Olympus PCF-H190DL (#[id number]) Colonoscope was                            introduced through the anus and advanced to the 2                            cm into the ileum. The  colonoscopy was performed                            without difficulty. The patient tolerated the                            procedure well. The quality of the bowel                            preparation was good. The terminal ileum, ileocecal                            valve, appendiceal orifice, and rectum were                            photographed. Scope In: 9:33:15 AM Scope Out: 1:50:56 AM Scope Withdrawal Time: 0 hours 18 minutes 45 seconds  Total Procedure Duration: 0 hours 22 minutes 57 seconds  Findings:                 Four sessile polyps were found in the mid sigmoid                            colon, proximal transverse colon, mid ascending                            colon and cecum. The polyps were 4 to 8 mm in size.                            These polyps were removed with a cold snare.                            Resection and retrieval were complete.                           A few rare small-mouthed diverticula were found in                            the sigmoid colon.  Non-bleeding external and internal hemorrhoids were                            found during retroflexion and during perianal exam.                            The hemorrhoids were small and Grade I (internal                            hemorrhoids that do not prolapse).                           The terminal ileum appeared normal.                           The exam was otherwise without abnormality on                            direct and retroflexion views. Complications:            No immediate complications. Estimated Blood Loss:     Estimated blood loss: none. Impression:               - Four 4 to 8 mm polyps in the mid sigmoid colon,                            in the proximal transverse colon, in the mid                            ascending colon and in the cecum, removed with a                            cold snare. Resected and retrieved.                           - Very  minimal sigmoid diverticulosis.                           - The examined portion of the ileum was normal.                           - The examination was otherwise normal on direct                            and retroflexion views. Recommendation:           - Patient has a contact number available for                            emergencies. The signs and symptoms of potential                            delayed complications were discussed with the  patient. Return to normal activities tomorrow.                            Written discharge instructions were provided to the                            patient.                           - Resume previous diet.                           - Continue present medications.                           - No ibuprofen, naproxen, or other non-steroidal                            anti-inflammatory drugs for 5 days after polyp                            removal.                           - Await pathology results.                           - Repeat colonoscopy for surveillance based on                            pathology results.                           - The findings and recommendations were discussed                            with the patient's family. Jackquline Denmark, MD 04/13/2022 10:01:57 AM This report has been signed electronically.

## 2022-04-13 NOTE — Patient Instructions (Signed)
Discharge instructions given. Handouts on polyps,diverticulosis and hemorrhoids. No ibuprofen,naproxen,or other non-steroidal anti-inflammatory drugs for 5 days. Resume previous medications. YOU HAD AN ENDOSCOPIC PROCEDURE TODAY AT [facility]:   Refer to the procedure report that was given to you for any specific questions about what was found during the examination.  If the procedure report does not answer your questions, please call your gastroenterologist to clarify.  If you requested that your care partner not be given the details of your procedure findings, then the procedure report has been included in a sealed envelope for you to review at your convenience later.  YOU SHOULD EXPECT: Some feelings of bloating in the abdomen. Passage of more gas than usual.  Walking can help get rid of the air that was put into your GI tract during the procedure and reduce the bloating. If you had a lower endoscopy (such as a colonoscopy or flexible sigmoidoscopy) you may notice spotting of blood in your stool or on the toilet paper. If you underwent a bowel prep for your procedure, you may not have a normal bowel movement for a few days.  Please Note:  You might notice some irritation and congestion in your nose or some drainage.  This is from the oxygen used during your procedure.  There is no need for concern and it should clear up in a day or so.  SYMPTOMS TO REPORT IMMEDIATELY:  Following lower endoscopy (colonoscopy or flexible sigmoidoscopy):  Excessive amounts of blood in the stool  Significant tenderness or worsening of abdominal pains  Swelling of the abdomen that is new, acute  Fever of 100F or higher  For urgent or emergent issues, a gastroenterologist can be reached at any hour by calling [phone]. Do not use MyChart messaging for urgent concerns.    DIET:  We do recommend a small meal at first, but then you may proceed to your regular diet.  Drink plenty of fluids but  you should avoid alcoholic beverages for 24 hours.  ACTIVITY:  You should plan to take it easy for the rest of today and you should NOT DRIVE or use heavy machinery until tomorrow (because of the sedation medicines used during the test).    FOLLOW UP: Our staff will call the number listed on your records the next business day following your procedure.  We will call around 7:15- 8:00 am to check on you and address any questions or concerns that you may have regarding the information given to you following your procedure. If we do not reach you, we will leave a message.  If you develop any symptoms (ie: fever, flu-like symptoms, shortness of breath, cough etc.) before then, please call [phone].  If you test positive for Covid 19 in the 2 weeks post procedure, please call and report this information to [place].    If any biopsies were taken you will be contacted by phone or by letter within the next 1-3 weeks.  Please call us at [phone] if you have not heard about the biopsies in 3 weeks.    SIGNATURES/CONFIDENTIALITY: You and/or your care partner have signed paperwork which will be entered into your electronic medical record.  These signatures attest to the fact that that the information above on your After Visit Summary has been reviewed and is understood.  Full responsibility of the confidentiality of this discharge information lies with you and/or your care-partner.

## 2022-04-13 NOTE — Progress Notes (Signed)
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 Authorizing Provider  Ascorbic Acid (VITAMIN C) 1000 MG tablet 1 tablet   Yes [provider]  Cholecalciferol (VITAMIN D-3) 125 MCG (5000 UT) TABS See admin instructions.   Yes [provider]  Coenzyme Q10 100 MG capsule See admin instructions.   Yes [provider]  Loratadine 10 MG CAPS    Yes [provider]  Multiple Vitamin (MULTIVITAMIN) tablet Take 1 tablet by mouth daily.   Yes [provider]  rosuvastatin (CRESTOR) 10 MG tablet 1 tablet 02/07/22  Yes [provider]  TURMERIC PO    Yes [provider]  vitamin E 180 MG (400 UNITS) capsule 1 capsule   Yes [provider]  albuterol (PROAIR HFA) 108 (90 Base) MCG/ACT inhaler 2 puffs as needed 12/18/19   [provider]  sildenafil (REVATIO) 20 MG tablet 1 to 5 tablets    [provider]    Current Outpatient Medications  Medication Sig Dispense Refill   Ascorbic Acid (VITAMIN C) 1000 MG tablet 1 tablet     Cholecalciferol (VITAMIN D-3) 125 MCG (5000 UT) TABS See admin instructions.     Coenzyme Q10 100 MG capsule See admin instructions.     Loratadine 10 MG CAPS      Multiple Vitamin (MULTIVITAMIN) tablet Take 1 tablet by mouth daily.     rosuvastatin (CRESTOR) 10 MG tablet 1 tablet     TURMERIC PO      vitamin E 180 MG (400  UNITS) capsule 1 capsule     albuterol (PROAIR HFA) 108 (90 Base) MCG/ACT inhaler 2 puffs as needed     sildenafil (REVATIO) 20 MG tablet 1 to 5 tablets     Current Facility-Administered Medications  Medication Dose Route Frequency Provider Last Rate Last Admin   0.9 %  sodium chloride infusion  500 mL Intravenous Once Jackquline Denmark, MD        Allergies as of 04/13/2022   (No Known Allergies)    Family History  Problem Relation Age of Onset   Ovarian cancer Mother    Diabetes Brother    Breast cancer Maternal Grandmother    Diabetes Paternal Grandmother    Stroke Paternal Grandmother    Esophageal cancer Paternal Grandfather    Colon cancer Neg Hx    Colon polyps Neg Hx    Stomach cancer Neg Hx     Social History   Socioeconomic History   Marital status: Married    Spouse name: Not on file   Number of children: Not on file   Years of education: Not on file   Highest education level: Not on file  Occupational History   Not on file  Tobacco Use   Smoking status: Never   Smokeless tobacco: Not on file  Vaping Use  Vaping Use: Never used  Substance and Sexual Activity   Alcohol use: No   Drug use: Not Currently   Sexual activity: Not on file  Other Topics Concern   Not on file  Social History Narrative   Not on file   Social Determinants of Health   Financial Resource Strain: Not on file  Food Insecurity: Not on file  Transportation Needs: Not on file  Physical Activity: Not on file  Stress: Not on file  Social Connections: Not on file  Intimate Partner Violence: Not on file    Review of Systems: Positive for none All other review of systems negative except as mentioned in the HPI.  Physical Exam: Vital signs in last 24 hours: '@VSRANGES'$ @   General:   Alert,  Well-developed, well-nourished, pleasant and cooperative in NAD Lungs:  Clear throughout to auscultation.   Heart:  Regular rate and rhythm; no murmurs, clicks, rubs,  or gallops. Abdomen:   Soft, nontender and nondistended. Normal bowel sounds.   Neuro/Psych:  Alert and cooperative. Normal mood and affect. A and O x 3    No significant changes were identified.  The patient continues to be an appropriate candidate for the planned procedure and anesthesia.   Carmell Austria, MD. [facility] 04/13/2022 9:28 AM@

## 2022-04-13 NOTE — Progress Notes (Signed)
A and O x3. Report to RN. Tolerated MAC anesthesia well. 

## 2022-04-13 NOTE — Progress Notes (Signed)
Called to room to assist during endoscopic procedure.  Patient ID and intended procedure confirmed with present staff. Received instructions for my participation in the procedure from the performing physician.  

## 2022-04-13 NOTE — Progress Notes (Signed)
Pt's states no medical or surgical changes since previsit or office visit. 

## 2022-04-16 ENCOUNTER — Telehealth: Payer: Self-pay

## 2022-04-16 NOTE — Telephone Encounter (Signed)
First post procedure follow up call, no answer 

## 2022-04-21 ENCOUNTER — Encounter: Payer: Self-pay | Admitting: Gastroenterology

## 2022-08-17 ENCOUNTER — Other Ambulatory Visit: Payer: Self-pay | Admitting: Acute Care

## 2022-08-17 ENCOUNTER — Encounter: Payer: Self-pay | Admitting: Orthopedic Surgery

## 2022-08-17 DIAGNOSIS — K862 Cyst of pancreas: Secondary | ICD-10-CM

## 2022-12-08 ENCOUNTER — Ambulatory Visit
Admission: RE | Admit: 2022-12-08 | Discharge: 2022-12-08 | Disposition: A | Discharge status: Home / Self Care | Payer: Medicare Other | Source: Ambulatory Visit | Attending: Acute Care | Admitting: Acute Care

## 2022-12-08 DIAGNOSIS — K862 Cyst of pancreas: Secondary | ICD-10-CM

## 2022-12-08 MED ORDER — GADOPICLENOL 0.5 MMOL/ML IV SOLN
8.1000 mL | Freq: Once | INTRAVENOUS | Status: AC | PRN
  Administered 2022-12-08: 8.1 mL via INTRAVENOUS

## 2023-04-20 ENCOUNTER — Encounter (HOSPITAL_COMMUNITY): Payer: Self-pay

## 2023-04-20 ENCOUNTER — Other Ambulatory Visit: Payer: Self-pay

## 2023-04-20 ENCOUNTER — Emergency Department (HOSPITAL_COMMUNITY): Payer: Medicare Other

## 2023-04-20 ENCOUNTER — Emergency Department (HOSPITAL_COMMUNITY)
Admission: EM | Admit: 2023-04-20 | Discharge: 2023-04-20 | Disposition: A | Discharge status: Home / Self Care | Payer: Medicare Other | Attending: Emergency Medicine | Admitting: Emergency Medicine

## 2023-04-20 DIAGNOSIS — R002 Palpitations: Secondary | ICD-10-CM | POA: Diagnosis not present

## 2023-04-20 DIAGNOSIS — J45909 Unspecified asthma, uncomplicated: Secondary | ICD-10-CM | POA: Insufficient documentation

## 2023-04-20 DIAGNOSIS — R079 Chest pain, unspecified: Secondary | ICD-10-CM | POA: Diagnosis present

## 2023-04-20 DIAGNOSIS — Z7951 Long term (current) use of inhaled steroids: Secondary | ICD-10-CM | POA: Diagnosis not present

## 2023-04-20 LAB — CBC
HCT: 48.8 % (ref 39.0–52.0)
Hemoglobin: 16.2 g/dL (ref 13.0–17.0)
MCH: 31.5 pg (ref 26.0–34.0)
MCHC: 33.2 g/dL (ref 30.0–36.0)
MCV: 94.9 fL (ref 80.0–100.0)
Platelets: 266 10*3/uL (ref 150–400)
RBC: 5.14 MIL/uL (ref 4.22–5.81)
RDW: 13.3 % (ref 11.5–15.5)
WBC: 7.5 10*3/uL (ref 4.0–10.5)
nRBC: 0 % (ref 0.0–0.2)

## 2023-04-20 LAB — BASIC METABOLIC PANEL
Anion gap: 10 (ref 5–15)
BUN: 15 mg/dL (ref 8–23)
CO2: 25 mmol/L (ref 22–32)
Calcium: 9.1 mg/dL (ref 8.9–10.3)
Chloride: 97 mmol/L — ABNORMAL LOW (ref 98–111)
Creatinine, Ser: 0.94 mg/dL (ref 0.61–1.24)
GFR, Estimated: >60 mL/min (ref >60)
Glucose, Bld: 135 mg/dL — ABNORMAL HIGH (ref 70–99)
Potassium: 4 mmol/L (ref 3.5–5.1)
Sodium: 132 mmol/L — ABNORMAL LOW (ref 135–145)

## 2023-04-20 LAB — TROPONIN I (HIGH SENSITIVITY)
Troponin I (High Sensitivity): 6 ng/L (ref <18)
Troponin I (High Sensitivity): 6 ng/L (ref <18)

## 2023-04-20 NOTE — ED Provider Notes (Signed)
[place] EMERGENCY DEPARTMENT AT [facility] Provider Note   CSN: [id number] Arrival date & time: 04/20/23  0941     History  Chief Complaint  Patient presents with   Chest Pain    Rickey Lucas is a 67 y.o. male.   Chest Pain Associated symptoms: palpitations   Patient presents for chest pain.  Medical history includes asthma.  He has never seen a cardiologist.  A year ago, his PCP ordered him a coronary CT.  Results did not show calcifications.  He is active and walks every day.  A week ago, he had a transient episode of palpitations.  On his watch, he had a heart rate in the 130s.  This resolved and he felt well throughout the week.  This morning, he again experienced palpitations.  Along with palpitations, he has some right-sided chest discomfort.  Heart rate on his watch was in the upper 120s.  He continued to have the symptoms when he arrived in the ED waiting room.  They have since resolved.  Currently, he is asymptomatic.     Home Medications Prior to Admission medications   Medication Sig Start Date End Date Taking? Authorizing Provider  albuterol (PROAIR HFA) 108 (90 Base) MCG/ACT inhaler 2 puffs as needed 12/18/19   [provider]  Ascorbic Acid (VITAMIN C) 1000 MG tablet 1 tablet    [provider]  Cholecalciferol (VITAMIN D-3) 125 MCG (5000 UT) TABS See admin instructions.    [provider]  Coenzyme Q10 100 MG capsule See admin instructions.    [provider]  Loratadine 10 MG CAPS     [provider]  Multiple Vitamin (MULTIVITAMIN) tablet Take 1 tablet by mouth daily.    [provider]  rosuvastatin (CRESTOR) 10 MG tablet 1 tablet 02/07/22   [provider]  sildenafil (REVATIO) 20 MG tablet 1 to 5 tablets    [provider]  TURMERIC PO     [provider]  vitamin E 180 MG (400 UNITS) capsule 1 capsule    [provider]      Allergies    Patient has no  known allergies.    Review of Systems   Review of Systems  Cardiovascular:  Positive for chest pain and palpitations.  All other systems reviewed and are negative.   Physical Exam Updated Vital Signs BP 123/80   Pulse (!) 59   Temp 98.4 F (36.9 C) (Oral)   Resp 18   Ht 6' (1.829 m)   Wt 81.6 kg   SpO2 98%   BMI 24.41 kg/m  Physical Exam Vitals and nursing note reviewed.  Constitutional:      General: He is not in acute distress.    Appearance: He is well-developed. He is not ill-appearing, toxic-appearing or diaphoretic.  HENT:     Head: Normocephalic and atraumatic.  Eyes:     Conjunctiva/sclera: Conjunctivae normal.  Cardiovascular:     Rate and Rhythm: Normal rate and regular rhythm.     Heart sounds: No murmur heard. Pulmonary:     Effort: Pulmonary effort is normal. No tachypnea or respiratory distress.     Breath sounds: Normal breath sounds. No decreased breath sounds, wheezing, rhonchi or rales.  Chest:     Chest wall: No tenderness.  Abdominal:     Palpations: Abdomen is soft.     Tenderness: There is no abdominal tenderness.  Musculoskeletal:        General: No swelling.  Normal range of motion.     Cervical back: Normal range of motion and neck supple.     Right lower leg: No edema.     Left lower leg: No edema.  Skin:    General: Skin is warm and dry.     Capillary Refill: Capillary refill takes less than 2 seconds.     Coloration: Skin is not cyanotic or pale.  Neurological:     General: No focal deficit present.     Mental Status: He is alert and oriented to person, place, and time.  Psychiatric:        Mood and Affect: Mood normal.        Behavior: Behavior normal.     ED Results / Procedures / Treatments   Labs (all labs ordered are listed, but only abnormal results are displayed) Labs Reviewed  BASIC METABOLIC PANEL - Abnormal; Notable for the following components:      Result Value   Sodium 132 (*)    Chloride 97 (*)    Glucose, Bld  135 (*)    All other components within normal limits  CBC  TROPONIN I (HIGH SENSITIVITY)  TROPONIN I (HIGH SENSITIVITY)    EKG EKG Interpretation Date/Time:  Saturday April 20 2023 09:56:19 EDT Ventricular Rate:  75 PR Interval:  154 QRS Duration:  82 QT Interval:  368 QTC Calculation: 410 R Axis:   4  Text Interpretation: Normal sinus rhythm Confirmed by Gloris Manchester [id number]) on 04/20/2023 11:16:59 AM  Radiology DG Chest 2 View  Result Date: 04/20/2023 CLINICAL DATA:  Chest pain EXAM: CHEST - 2 VIEW COMPARISON:  None Available. FINDINGS: The heart size and mediastinal contours are within normal limits. Both lungs are clear. The visualized skeletal structures are unremarkable. IMPRESSION: No active cardiopulmonary disease. Electronically Signed   By: Kennith Center M.D.   On: 04/20/2023 10:31    Procedures Procedures    Medications Ordered in ED Medications - No data to display  ED Course/ Medical Decision Making/ A&P                                 Medical Decision Making Amount and/or Complexity of Data Reviewed Labs: ordered. Radiology: ordered.   This patient presents to the ED for concern of chest pain and palpitations, this involves an extensive number of treatment options, and is a complaint that carries with it a high risk of complications and morbidity.  The differential diagnosis includes arrhythmia, ACS, pericarditis, pneumonia   Co morbidities that complicate the patient evaluation  Asthma   Additional history obtained:  Additional history obtained from patient's wife External records from outside source obtained and reviewed including EMR   Lab Tests:  I Ordered, and personally interpreted labs.  The pertinent results include: Normal hemoglobin, no leukocytosis, normal electrolytes, normal kidney function, normal troponin   Imaging Studies ordered:  I ordered imaging studies including chest x-ray I independently visualized and interpreted imaging  which showed no acute findings I agree with the radiologist interpretation   Cardiac Monitoring: / EKG:  The patient was maintained on a cardiac monitor.  I personally viewed and interpreted the cardiac monitored which showed an underlying rhythm of: Sinus rhythm  Problem List / ED Course / Critical interventions / Medication management  Patient presents for transient palpitations and chest pain.  He had a brief episode of this week ago and had recurrence of it this morning.  Initial EKG shows a normal sinus rhythm with normal heart rate.  Initial blood pressure was elevated.  He does not have a history of hypertension.  Upon being bedded in the ED, patient is asymptomatic.  He is well-appearing on exam.  No cardiac rubs, murmurs, or arrhythmias are appreciated on auscultation.  Patient was placed on bedside cardiac monitor.  Second troponin was also reassuring.  Patient remained in normal sinus rhythm with heart rate in the range of low 60s while in the ED.  He remained asymptomatic.  He would benefit from cardiology follow-up with consideration of Zio patch monitoring.  Cardiology referral was ordered.  Patient was advised to return for any further episodes of concern.  He was discharged in good condition.   Social Determinants of Health:  Has PCP        Final Clinical Impression(s) / ED Diagnoses Final diagnoses:  Palpitations    Rx / DC Orders ED Discharge Orders          Ordered    Ambulatory referral to Cardiology       Comments: If you have not heard from the Cardiology office within the next 72 hours please call [phone].   04/20/23 1329              Gloris Manchester, MD 04/20/23 1331

## 2023-04-20 NOTE — ED Triage Notes (Signed)
Pt arrived POV from home c/o centralized CP that happens when he feels like his heart is racing. Pt states it is not doing it now but he has had several episodes this morning.

## 2023-04-20 NOTE — Discharge Instructions (Addendum)
Lab work and cardiac monitoring while you were in the emergency department are reassuring.  You should hear from the cardiology office to schedule a close follow-up appointment.  If you do not hear from them by Monday, call the number below.  When you establish care, discuss a wearable cardiac monitoring device to help diagnose your episodes of palpitations.  If you have any further sustained episodes, please return to the emergency department.

## 2023-07-08 ENCOUNTER — Ambulatory Visit: Payer: Medicare Other | Attending: Cardiovascular Disease | Admitting: Cardiovascular Disease

## 2023-07-08 ENCOUNTER — Encounter: Payer: Self-pay | Admitting: Cardiovascular Disease

## 2023-07-08 VITALS — BP 143/82 | HR 71 | Ht 71.75 in | Wt 185.2 lb

## 2023-07-08 DIAGNOSIS — I25118 Atherosclerotic heart disease of native coronary artery with other forms of angina pectoris: Secondary | ICD-10-CM | POA: Insufficient documentation

## 2023-07-08 DIAGNOSIS — R030 Elevated blood-pressure reading, without diagnosis of hypertension: Secondary | ICD-10-CM | POA: Insufficient documentation

## 2023-07-08 DIAGNOSIS — I209 Angina pectoris, unspecified: Secondary | ICD-10-CM | POA: Diagnosis present

## 2023-07-08 DIAGNOSIS — K862 Cyst of pancreas: Secondary | ICD-10-CM | POA: Diagnosis present

## 2023-07-08 DIAGNOSIS — R002 Palpitations: Secondary | ICD-10-CM | POA: Insufficient documentation

## 2023-07-08 DIAGNOSIS — Z01812 Encounter for preprocedural laboratory examination: Secondary | ICD-10-CM | POA: Insufficient documentation

## 2023-07-08 DIAGNOSIS — I200 Unstable angina: Secondary | ICD-10-CM

## 2023-07-08 DIAGNOSIS — E7800 Pure hypercholesterolemia, unspecified: Secondary | ICD-10-CM | POA: Diagnosis present

## 2023-07-08 MED ORDER — METOPROLOL SUCCINATE ER 25 MG PO TB24: 25.0000 mg | ORAL_TABLET | Freq: Every day | ORAL | 3 refills | Status: DC

## 2023-07-08 MED ORDER — METOPROLOL TARTRATE 50 MG PO TABS: ORAL_TABLET | ORAL | 3 refills | Status: DC

## 2023-07-08 NOTE — Progress Notes (Unsigned)
Cardiology Office Note:  .   Date:  07/09/2023  ID:  Rickey Lucas, DOB 1955-11-23, MRN [id number] PCP: Rickey Joe, MD  [facility] Providers Cardiologist:  None    History of Present Illness: Rickey Lucas   Rickey Lucas is a 67 y.o. male with hypercholesterolemia, without known structural cardiac disease, who has a strong family history of premature onset CAD and cardiac arrest who has had a remote episode of syncope in 2019, recent episodes of abrupt onset tachycardia and an elevated coronary calcium score.  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  He has generally been healthy his whole life and is physically active.  He walks every day roughly 3 miles in 1 hour.  And more recently has added another 2 miles or so on a daily basis.  [place].  Occasionally while walking he does have some right parasternal discomfort but it is always very mild and resolves very promptly if he slows down his pace he does not have to stop and rest for it to go away.  He does not have exertional dyspnea, orthopnea, PND, lower extremity edema or claudication he denies any focal neurological events.  He has had a couple of episodes of tachycardia that he described as having very abrupt onset and very abrupt termination.  The spell sometimes associated mild lightheadedness and some chest discomfort.  In the past he has been able to terminate these episodes by coughing or by placing his hands in cold water.  The heart rate is around 140 bpm.  1 of these episodes did not resolve spontaneously and he went to the emergency room 04/20/2023 so went to the emergency room.  While he was waiting to be triaged the arrhythmia abruptly terminated and he remained asymptomatic during his evaluation, which showed benign findings (ECG was normal, normal sinus rhythm, high-sensitivity troponin negative, normal chest x-ray and routine labs).    He has an Scientist, physiological and has not been able to  catch any one of the episodes of sustained tachycardia on a rhythm strip, but has shown periods where he has palpitations associated with very frequent PVCs occurring every 3-4 beats.  In 2019 he had an episode of syncope.  That episode was not associated with palpitations.  It happened when he had had poor oral oral intake and had a beer to drink and smoked a cigar and sounded more vasovagal.  He was probably dehydrated at the time and he improved with IV fluids.  It remains the only episode of full-blown syncope he is ever had.  He underwent a coronary calcium score in June 2023 with an absolute score of 512, placing him in the 79th percentile for age and gender.  His father had sudden cardiac arrest at age 62 which she survived and subsequently had bypass surgery and received a defibrillator.  His ejection fraction was severely decreased.  He eventually died of mesothelioma many years later.  The patient's cholesterol profile is excellent on statin therapy with an HDL of 61, LDL 62, triglycerides 66.  In the past he had a borderline hemoglobin A1c at 5.8%, but has never been declared to have full-blown diabetes.  He does not smoke cigarettes.  His blood pressure is mildly elevated today, but usually is in the 120/70 range.  He has erectile dysfunction which responds well to sildenafil.  ROS: As described above, otherwise unremarkable  Studies Reviewed: Rickey Lucas   EKG Interpretation Date/Time:  Monday  July 08 2023 15:41:41 EST Ventricular Rate:  71 PR Interval:  158 QRS Duration:  86 QT Interval:  390 QTC Calculation: 423 R Axis:   -7  Text Interpretation: Normal sinus rhythm Normal ECG When compared with ECG of 20-Apr-2023 09:56, No significant change was found Confirmed by Nautika Cressey ([id number]) on 07/08/2023 3:52:37 PM     Risk Assessment/Calculations:     HYPERTENSION CONTROL Vitals:   07/08/23 1534 07/08/23 1632  BP: (!) 156/84 (!) 143/82    The patient's blood pressure is elevated  above target today.  In order to address the patient's elevated BP: Blood pressure will be monitored at home to determine if medication changes need to be made.          Physical Exam:   VS:  BP (!) 143/82   Pulse 71   Ht 5' 11.75" (1.822 m)   Wt 185 lb 3.2 oz (84 kg)   SpO2 96%   BMI 25.29 kg/m    Wt Readings from Last 3 Encounters:  07/08/23 185 lb 3.2 oz (84 kg)  04/20/23 180 lb (81.6 kg)  04/13/22 180 lb (81.6 kg)    GEN: Well nourished, well developed in no acute distress NECK: No JVD; No carotid bruits CARDIAC: RRR, no murmurs, rubs, gallops RESPIRATORY:  Clear to auscultation without rales, wheezing or rhonchi  ABDOMEN: Soft, non-tender, non-distended EXTREMITIES:  No edema; No deformity   ASSESSMENT AND PLAN: .   CAD: Dr. Vear Clock has an elevated coronary calcium score that places him in a CV risk group equivalent to known CAD.  He has some symptoms that suggest possible exertional angina pectoris.  His lipid profile is well-controlled on statin and he does not have other modifiable major risk factors for coronary disease, but does have a family history of early onset severe CAD.  Recommend coronary CT angiography. HLP: Excellent lipid profile on statin.  Continue.  Target LDL less than 70 has been achieved. Elevated BP: Usually his blood pressure is normal.  Today's reading is unusual.  Will monitor. ED: Responds well to PDE 5.  On physical exam he has normal distal pulses, no other signs of significant PAD. 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  He has an Apple watch and hopefully if another episode recurs we will be able to documented to using this device and send it to [place] via MyChart.  He tells me that his Apple Watch is also showed frequent PVCs, but these are not a  frequent problem.  Start low-dose beta-blocker therapy with metoprolol succinate 25 mg once daily, but at this point there appears to be no reason to start more aggressive antiarrhythmic therapy, either pharmacological or ablation. 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  However, it has been stable on serial studies, most recently performed in April 2024, with a plan for follow-up in 2 years.       Dispo: Recommend coronary CT angiogram.  Update renal function studies before that.  Monitor BP at home and send [place] a log.  Send any suspicious rhythm problems recording on a smart block should be on MyChart.  Signed, Thurmon Fair, MD

## 2023-07-08 NOTE — Patient Instructions (Signed)
Medication Instructions:  METOPROLOL TARTRATE 1 tablet (50mg ) on morning of cardiac testing.   Start Metoprolol succinate 25mg  daily    *If you need a refill on your cardiac medications before your next appointment, please call your pharmacy*   Lab Work: BMET    If you have labs (blood work) drawn today and your tests are completely normal, you will receive your results only by: MyChart Message (if you have MyChart) OR A paper copy in the mail If you have any lab test that is abnormal or we need to change your treatment, we will call you to review the results.   Testing/Procedures:   Your cardiac CT will be scheduled at one of the below locations:   [facility] [address] [place], [place] [address] [phone]   If scheduled at [facility], please arrive at the [facility] and Children's Entrance (Entrance C2) of [facility] 30 minutes prior to test start time. You can use the FREE valet parking offered at entrance C (encouraged to control the heart rate for the test)  Proceed to the [facility] Radiology Department (first floor) to check-in and test prep.  All radiology patients and guests should use entrance C2 at [facility], accessed from [facility], even though the hospital's physical address listed is [address]    If scheduled at [facility] or [facility], please arrive 15 mins early for check-in and test prep.  There is spacious parking and easy access to the radiology department from the [facility] entrance. Please enter here and check-in with the desk attendant.   Please follow these instructions carefully (unless otherwise directed):  An IV will be required for this test and Nitroglycerin will be given.  Hold all erectile dysfunction medications at least 3 days (72 hrs) prior to test. (Ie viagra, cialis, sildenafil, tadalafil, etc)   On  the Night Before the Test: Be sure to Drink plenty of water. Do not consume any caffeinated/decaffeinated beverages or chocolate 12 hours prior to your test. Do not take any antihistamines 12 hours prior to your test.  On the Day of the Test: Drink plenty of water until 1 hour prior to the test. Do not eat any food 1 hour prior to test. You may take your regular medications prior to the test.  Take metoprolol (Lopressor) two hours prior to test. If you take Furosemide/Hydrochlorothiazide/Spironolactone, please HOLD on the morning of the test.   After the Test: Drink plenty of water. After receiving IV contrast, you may experience a mild flushed feeling. This is normal. On occasion, you may experience a mild rash up to 24 hours after the test. This is not dangerous. If this occurs, you can take Benadryl 25 mg and increase your fluid intake. If you experience trouble breathing, this can be serious. If it is severe call 911 IMMEDIATELY. If it is mild, please call our office. If you take any of these medications: Glipizide/Metformin, Avandament, Glucavance, please do not take 48 hours after completing test unless otherwise instructed.  We will call to schedule your test 2-4 weeks out understanding that some insurance companies will need an authorization prior to the service being performed.   For more information and frequently asked questions, please visit our website : [email or web address]  For non-scheduling related questions, please contact the cardiac imaging nurse navigator should you have any questions/concerns: Cardiac Imaging Nurse Navigators Direct Office Dial: [phone]  For scheduling needs, including cancellations and rescheduling, please call [place], [phone].    Follow-Up: At [facility], you and your health needs are our priority.  As part of our continuing mission to provide you with exceptional heart care, we have created designated Provider  Care Teams.  These Care Teams include your primary Cardiologist (physician) and Advanced Practice Providers (APPs -  Physician Assistants and Nurse Practitioners) who all work together to provide you with the care you need, when you need it.  We recommend signing up for the patient portal called "MyChart".  Sign up information is provided on this After Visit Summary.  MyChart is used to connect with patients for Virtual Visits (Telemedicine).  Patients are able to view lab/test results, encounter notes, upcoming appointments, etc.  Non-urgent messages can be sent to your provider as well.   To learn more about what you can do with MyChart, go to [email or web address].    Your next appointment:   6 month(s)  The format for your next appointment:   In Person  Provider:   Dr. Thurmon Fair, MD   Other Instructions

## 2023-07-09 ENCOUNTER — Telehealth: Payer: Self-pay | Admitting: Cardiovascular Disease

## 2023-07-09 ENCOUNTER — Encounter: Payer: Self-pay | Admitting: Cardiovascular Disease

## 2023-07-09 DIAGNOSIS — R002 Palpitations: Secondary | ICD-10-CM

## 2023-07-09 NOTE — Telephone Encounter (Signed)
Pt c/o medication issue:  1. Name of Medication:   metoprolol tartrate (LOPRESSOR) 50 MG tablet    2. How are you currently taking this medication (dosage and times per day)?  Take 1 tablet ( 50 mg) once on the morning of cardiac testing       3. Are you having a reaction (difficulty breathing--STAT)? No  4. What is your medication issue? Pharmacy calling to get the correct directions for this medication. Please advise

## 2023-07-09 NOTE — Telephone Encounter (Signed)
He is supposed to take metoprolol succinate 25 mg daily, long term treatment (independent of the CT). He should have  a 3 month supply of this with 3 refills. On the day of the CT he is supposed to also take a single dose of metoprolol tartrate 50 mg 2 hours before the procedure. He should have a prescription for one dose of this, no refills.

## 2023-07-10 LAB — BASIC METABOLIC PANEL
BUN/Creatinine Ratio: 17 (ref 10–24)
BUN: 15 mg/dL (ref 8–27)
CO2: 22 mmol/L (ref 20–29)
Calcium: 9.5 mg/dL (ref 8.6–10.2)
Chloride: 97 mmol/L (ref 96–106)
Creatinine, Ser: 0.86 mg/dL (ref 0.76–1.27)
Glucose: 87 mg/dL (ref 70–99)
Potassium: 4.7 mmol/L (ref 3.5–5.2)
Sodium: 139 mmol/L (ref 134–144)
eGFR: 94 mL/min/{1.73_m2} (ref >59)

## 2023-07-10 MED ORDER — METOPROLOL TARTRATE 50 MG PO TABS
ORAL_TABLET | ORAL | 0 refills | Status: DC
Start: 2023-07-15 — End: 2023-08-05

## 2023-07-10 NOTE — Telephone Encounter (Signed)
Spoke with Kathie Rhodes at the pharmacy and she is aware metoprolol tartrate 50 mg patient only needs 1 tablet to take 2 hours before his CT testing.  Metoprolol succinate 25 mg is daily

## 2023-07-24 ENCOUNTER — Encounter (HOSPITAL_COMMUNITY): Payer: Self-pay

## 2023-07-26 ENCOUNTER — Ambulatory Visit (HOSPITAL_COMMUNITY)
Admission: RE | Admit: 2023-07-26 | Discharge: 2023-07-26 | Disposition: A | Discharge status: Home / Self Care | Payer: Medicare Other | Source: Ambulatory Visit | Attending: Cardiovascular Disease | Admitting: Cardiovascular Disease

## 2023-07-26 DIAGNOSIS — I2510 Atherosclerotic heart disease of native coronary artery without angina pectoris: Secondary | ICD-10-CM | POA: Insufficient documentation

## 2023-07-26 DIAGNOSIS — R002 Palpitations: Secondary | ICD-10-CM | POA: Insufficient documentation

## 2023-07-26 MED ORDER — NITROGLYCERIN 0.4 MG SL SUBL
0.8000 mg | SUBLINGUAL_TABLET | Freq: Once | SUBLINGUAL | Status: AC
  Administered 2023-07-26: 0.8 mg via SUBLINGUAL

## 2023-07-26 MED ORDER — NITROGLYCERIN 0.4 MG SL SUBL
SUBLINGUAL_TABLET | SUBLINGUAL | Status: AC
  Filled 2023-07-26: qty 2

## 2023-07-26 MED ORDER — IOHEXOL 350 MG/ML SOLN
100.0000 mL | Freq: Once | INTRAVENOUS | Status: AC | PRN
  Administered 2023-07-26: 100 mL via INTRAVENOUS

## 2023-07-29 ENCOUNTER — Other Ambulatory Visit: Payer: Self-pay | Admitting: Internal Medicine

## 2023-07-29 ENCOUNTER — Telehealth: Payer: Self-pay | Admitting: Emergency Medicine

## 2023-07-29 ENCOUNTER — Ambulatory Visit (HOSPITAL_COMMUNITY)
Admission: RE | Admit: 2023-07-29 | Discharge: 2023-07-29 | Disposition: A | Discharge status: Home / Self Care | Payer: Medicare Other | Source: Ambulatory Visit | Attending: Internal Medicine | Admitting: Internal Medicine

## 2023-07-29 DIAGNOSIS — I2510 Atherosclerotic heart disease of native coronary artery without angina pectoris: Secondary | ICD-10-CM | POA: Diagnosis not present

## 2023-07-29 DIAGNOSIS — R931 Abnormal findings on diagnostic imaging of heart and coronary circulation: Secondary | ICD-10-CM

## 2023-07-29 DIAGNOSIS — I25118 Atherosclerotic heart disease of native coronary artery with other forms of angina pectoris: Secondary | ICD-10-CM

## 2023-07-29 DIAGNOSIS — Z01812 Encounter for preprocedural laboratory examination: Secondary | ICD-10-CM

## 2023-07-29 MED ORDER — ASPIRIN 81 MG PO TBEC
81.0000 mg | DELAYED_RELEASE_TABLET | Freq: Every day | ORAL | Status: AC
Start: 2023-07-29 — End: ?

## 2023-07-29 NOTE — Progress Notes (Signed)
CT sent for FFR 

## 2023-07-29 NOTE — Telephone Encounter (Signed)
Called the patient and went over the information below. He knows to get lab work drawn by Wednesday 08/07/23. He verbalized understanding of all instructions. Will send through MyChart as well.    [facility] A DEPT OF [facility] AT [facility] [address] [place] [address] [place] [address] Dept: [phone] Loc: [phone]  Rickey Lucas  07/29/2023  You are scheduled for a Cardiac Catheterization on Friday December 6th with Dr. [name]  1. Please arrive at the [facility] (Main Entrance A) at [facility]: [address] [place], [place] [address] at 0830 (This time is 2 hour(s) before your procedure to ensure your preparation).   Free valet parking service is available. You will check in at ADMITTING. The support person will be asked to wait in the waiting room.  It is OK to have someone drop you off and come back when you are ready to be discharged.    Special note: Every effort is made to have your procedure done on time. Please understand that emergencies sometimes delay scheduled procedures.  2. Diet: Do not eat solid foods after midnight.  The patient may have clear liquids until 5am upon the day of the procedure.  3. Labs: CBC and BMP- get drawn by Wednesday 08/07/23  4. Medication instructions in preparation for your procedure: Do not take Sildenafil within 24 hours of procedure   Contrast Allergy: No  On the morning of your procedure, take your Aspirin 81 mg and any morning medicines NOT listed above.  You may use sips of water.  5. Plan to go home the same day, you will only stay overnight if medically necessary. 6. Bring a current list of your medications and current insurance cards. 7. You MUST have a responsible person to drive you home. 8. Someone MUST be with you the first 24 hours after you arrive home or your discharge will be delayed. 9. Please wear clothes that are easy to get on and  off and wear slip-on shoes.  Thank you for allowing [place] to care for you!   -- [facility]

## 2023-07-29 NOTE — Progress Notes (Signed)
[place] next week.

## 2023-07-30 ENCOUNTER — Other Ambulatory Visit: Payer: Self-pay

## 2023-07-30 DIAGNOSIS — Z01812 Encounter for preprocedural laboratory examination: Secondary | ICD-10-CM

## 2023-07-30 DIAGNOSIS — I25118 Atherosclerotic heart disease of native coronary artery with other forms of angina pectoris: Secondary | ICD-10-CM

## 2023-07-31 LAB — BASIC METABOLIC PANEL
BUN/Creatinine Ratio: 14 (ref 10–24)
BUN: 11 mg/dL (ref 8–27)
CO2: 25 mmol/L (ref 20–29)
Calcium: 9 mg/dL (ref 8.6–10.2)
Chloride: 98 mmol/L (ref 96–106)
Creatinine, Ser: 0.81 mg/dL (ref 0.76–1.27)
Glucose: 88 mg/dL (ref 70–99)
Potassium: 4.2 mmol/L (ref 3.5–5.2)
Sodium: 136 mmol/L (ref 134–144)
eGFR: 96 mL/min/{1.73_m2} (ref >59)

## 2023-07-31 LAB — CBC
Hematocrit: 49.4 % (ref 37.5–51.0)
Hemoglobin: 16.3 g/dL (ref 13.0–17.7)
MCH: 31.9 pg (ref 26.6–33.0)
MCHC: 33 g/dL (ref 31.5–35.7)
MCV: 97 fL (ref 79–97)
Platelets: 287 10*3/uL (ref 150–450)
RBC: 5.11 x10E6/uL (ref 4.14–5.80)
RDW: 12.3 % (ref 11.6–15.4)
WBC: 7.4 10*3/uL (ref 3.4–10.8)

## 2023-08-06 ENCOUNTER — Ambulatory Visit: Payer: Medicare Other | Attending: Cardiovascular Disease | Admitting: Cardiovascular Disease

## 2023-08-06 DIAGNOSIS — I25118 Atherosclerotic heart disease of native coronary artery with other forms of angina pectoris: Secondary | ICD-10-CM

## 2023-08-06 NOTE — Progress Notes (Signed)
Virtual Visit via Telephone Note   Because of Rickey Lucas's co-morbid illnesses, he is at least at moderate risk for complications without adequate follow up.  This format is felt to be most appropriate for this patient at this time.  The patient did not have access to video technology/had technical difficulties with video requiring transitioning to audio format only (telephone).  All issues noted in this document were discussed and addressed.  No physical exam could be performed with this format.  Please refer to the patient's chart for his consent to telehealth for [name].    Date:  08/06/2023   ID:  Rickey Lucas, DOB 07-May-1956, MRN [id number] The patient was identified using 2 identifiers.  Patient Location: Home Provider Location: Office/Clinic   PCP:  Tally Joe, MD   [facility] Providers Cardiologist:  None     Evaluation Performed:  Follow-Up Visit  Chief Complaint:  CAD  History of Present Illness:    Rickey Lucas is a 67 y.o. male with hypercholesterolemia, without known structural cardiac disease, who has a strong family history of premature onset CAD and cardiac arrest who has had a remote episode of syncope in 2019, recent episodes of abrupt onset tachycardia and an abnormal coronary CTA.  The CT angiogram performed 07/26/2023 showed moderate to possibly severe stenosis of the ostial/proximal LAD artery which was significant by CT FFR (0.73 after the first diagonal branch, 0.66 in the distal vessel).  In addition there was moderate stenosis due to mixed plaque in the AV groove portion of the left circumflex coronary artery (50 to 69%), if this was not significant by CT FFR (0.88).   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  He has generally been healthy his whole life and is physically active.  He walks every day roughly 3 miles in 1 hour.  And more recently has added another 2 miles or so on a daily basis.  [place]  [place]  Occasionally while walking he does have some right parasternal discomfort but it is always very mild and resolves very promptly if he slows down his pace he does not have to stop and rest for it to go away.  He does not have exertional dyspnea, orthopnea, PND, lower extremity edema or claudication he denies any focal neurological events.   He has had a couple of episodes of tachycardia that he described as having very abrupt onset and very abrupt termination.  The spell sometimes associated mild lightheadedness and some chest discomfort.  In the past he has been able to terminate these episodes by coughing or by placing his hands in cold water.  The heart rate is around 140 bpm.  1 of these episodes did not resolve spontaneously and he went to the emergency room 04/20/2023 so went to the emergency room.  While he was waiting to be triaged the arrhythmia abruptly terminated and he remained asymptomatic during his evaluation, which showed benign findings (ECG was normal, normal sinus rhythm, high-sensitivity troponin negative, normal chest x-ray and routine labs).     He has an Scientist, physiological and has not been able to catch any one of the episodes of sustained tachycardia on a rhythm strip, but has shown periods where he has palpitations associated with very frequent PVCs occurring every 3-4 beats.   In 2019 he had an episode of syncope.  That episode was not associated with palpitations.  It happened when he had  had poor oral oral intake and had a beer to drink and smoked a cigar and sounded more vasovagal.  He was probably dehydrated at the time and he improved with IV fluids.  It remains the only episode of full-blown syncope he is ever had.   He underwent a coronary calcium score in June 2023 with an absolute score of 512, placing him in the 79th percentile for age and gender.  His father had sudden cardiac arrest at age 45 which she survived and subsequently had bypass surgery  and received a defibrillator.  His ejection fraction was severely decreased.  He eventually died of mesothelioma many years later.  The patient's cholesterol profile is excellent on statin therapy with an HDL of 61, LDL 62, triglycerides 66.  In the past he had a borderline hemoglobin A1c at 5.8%, but has never been declared to have full-blown diabetes.  He does not smoke cigarettes.  His blood pressure usually is in the 120/70 range.  He has erectile dysfunction which responds well to sildenafil.   Past Medical History:  Diagnosis Date   Asthma    Past Surgical History:  Procedure Laterality Date   COLONOSCOPY  2000   Buccini-normal   TONSILLECTOMY  1959     No outpatient medications have been marked as taking for the 08/06/23 encounter (Appointment) with Thurmon Fair, MD.     Allergies:   Patient has no known allergies.   Social History   Tobacco Use   Smoking status: Never  Vaping Use   Vaping status: Never Used  Substance Use Topics   Alcohol use: No   Drug use: Not Currently     Family Hx: The patient's family history includes Breast cancer in his maternal grandmother; Diabetes in his brother and paternal grandmother; Esophageal cancer in his paternal grandfather; Ovarian cancer in his mother; Stroke in his paternal grandmother. There is no history of Colon cancer, Colon polyps, or Stomach cancer.  ROS:   Please see the history of present illness.     All other systems reviewed and are negative.   Prior CV studies:   The following studies were reviewed today:  07/26/2023 Coronary CTA  Coronary calcium score: The patient's coronary artery calcium score is 468, which places the patient in the 75th percentile [facility]).   Coronary arteries: Normal coronary origins.  Right dominance.   Right Coronary Artery: Dominant. There is minimal mixed 1-24% proximal and distal stenosis. The R-PLB branch is normal. The proximal R-PDA branch has moderate to likely severe  stenosis (70-99%) at a 90 degree bend in the vessel.   Left Main Coronary Artery: Normal. Bifurcates into the LAD and LCX arteries.   Left Anterior Descending Coronary Artery: Heavy calcification of the ostial/proximal LAD with at least moderate stenosis (50-69%). Tortuous D1 branch with minimal mixed 1-24% proximal stenosis. Smaller D2 branch without disease.   Left Circumflex Artery: AV groove vessel with ostial/proximal mixed plaque with low attenuation features and (50-69%) mixed stenosis. The distal LCX is normal. Large OM branch with minimal 1-24% proximal non-calcified stenosis.   Aorta: Normal size, 35 mm at the mid ascending aorta (level of the PA bifurcation) measured double oblique. No calcifications. No dissection.   Aortic Valve: Trileaflet. No calcifications.   Other findings:   Normal pulmonary vein drainage into the left atrium.   Normal left atrial appendage without a thrombus.   Normal size of the pulmonary artery.   IMPRESSION: 1. 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  CT FFR will be performed and reported separately.   2. Coronary artery calcium score is 468, which places the patient in the 75th percentile for age/race and sex-matched control (MESA).   3. Normal coronary origin with right dominance.   4. Aggressive risk factor modification recommended. 1. CT FFR analysis does show significant discrete stenosis (<0.75) of the mid to distal LAD after the D1 branch.   2. The R-PDA demonstrates borderline significant stenosis (FFR = 0.77)   2. Recommend aggressive medical therapy and consider cardiac catheterization based on correlation with anginal symptoms.  Labs/Other Tests and Data Reviewed:    EKG:  EKG Interpretation Date/Time:                  Monday July 08 2023 15:41:41 EST Ventricular Rate:         71 PR Interval:                 158 QRS Duration:             86 QT Interval:                 390 QTC Calculation:423 R  Axis:                         -7   Text Interpretation:Normal sinus rhythm Normal ECG When compared with ECG of 20-Apr-2023 09:56, No significant change was found Confirmed by Wyndi Northrup [phone]) on 07/08/2023 3:52:37 PM      Recent Labs: 07/30/2023: BUN 11; Creatinine, Ser 0.81; Hemoglobin 16.3; Platelets 287; Potassium 4.2; Sodium 136   Recent Lipid Panel No results found for: "CHOL", "TRIG", "HDL", "CHOLHDL", "LDLCALC", "LDLDIRECT"  on statin therapy with an HDL of 61, LDL 62, triglycerides 6   Wt Readings from Last 3 Encounters:  07/08/23 185 lb 3.2 oz (84 kg)  04/20/23 180 lb (81.6 kg)  04/13/22 180 lb (81.6 kg)     Risk Assessment/Calculations:          Objective:    Vital Signs:  There were no vitals taken for this visit.     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  His coronary CTA describes an ostial-proximal LAD stenosis that appears to be significant by CT FFR, at least when measured beyond the first diagonal branch.  His lipid profile is well-controlled on statin and he does not have other modifiable major risk factors for coronary disease, but does have a family history of early onset severe CAD.  Recommend invasive coronary angiography and percutaneous revascularization if indicated.  He is scheduled for Friday, December 5 with Dr. Excell Seltzer. This procedure has been fully reviewed with the patient and written informed consent has been obtained. HLP: Excellent lipid profile on statin.  Continue.  Target LDL less than 70 has been achieved. Elevated BP: Usually his blood pressure is normal.  ED: Responds well to PDE 5.  On physical exam he has normal distal pulses, no other signs of significant PAD.  Aware that he should not take sildenafil 24 hours before the cardiac catheterization. 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  He has an Apple watch and hopefully if another episode recurs we will be able to documented to  using this device and send it to [place] via MyChart.  He tells me that his Apple Watch is also showed frequent PVCs, but these are not a frequent problem.  Start low-dose beta-blocker therapy with metoprolol succinate 25 mg once daily, but at this point there appears to be no reason to start more aggressive antiarrhythmic therapy, either pharmacological or ablation. 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  However, it has been stable on serial studies, most recently performed in April 2024, with a plan for follow-up in 2 years.         Informed Consent   Shared Decision Making/Informed Consent The risks [stroke (1 in 1000), death (1 in 1000), kidney failure [usually temporary] (1 in 500), bleeding (1 in 200), allergic reaction [possibly serious] (1 in 200)], benefits (diagnostic support and management of coronary artery disease) and alternatives of a cardiac catheterization were discussed in detail with Mr. Silvas and he is willing to proceed.        Time:   Today, I have spent 12 minutes with the patient with telehealth technology discussing the above problems.     Medication Adjustments/Labs and Tests Ordered: Current medicines are reviewed at length with the patient today.  Concerns regarding medicines are outlined above.   Tests Ordered: No orders of the defined types were placed in this encounter.   Medication Changes: No orders of the defined types were placed in this encounter.     Signed, Thurmon Fair, MD  08/06/2023 8:28 AM    [facility]

## 2023-08-06 NOTE — H&P (View-Only) (Signed)
 Virtual Visit via Telephone Note   Because of Rickey Lucas's co-morbid illnesses, he is at least at moderate risk for complications without adequate follow up.  This format is felt to be most appropriate for this patient at this time.  The patient did not have access to video technology/had technical difficulties with video requiring transitioning to audio format only (telephone).  All issues noted in this document were discussed and addressed.  No physical exam could be performed with this format.  Please refer to the patient's chart for his consent to telehealth for [name].    Date:  08/06/2023   ID:  Rickey Lucas, DOB 07-May-1956, MRN [id number] The patient was identified using 2 identifiers.  Patient Location: Home Provider Location: Office/Clinic   PCP:  Tally Joe, MD   [facility] Providers Cardiologist:  None     Evaluation Performed:  Follow-Up Visit  Chief Complaint:  CAD  History of Present Illness:    Rickey Lucas is a 67 y.o. male with hypercholesterolemia, without known structural cardiac disease, who has a strong family history of premature onset CAD and cardiac arrest who has had a remote episode of syncope in 2019, recent episodes of abrupt onset tachycardia and an abnormal coronary CTA.  The CT angiogram performed 07/26/2023 showed moderate to possibly severe stenosis of the ostial/proximal LAD artery which was significant by CT FFR (0.73 after the first diagonal branch, 0.66 in the distal vessel).  In addition there was moderate stenosis due to mixed plaque in the AV groove portion of the left circumflex coronary artery (50 to 69%), if this was not significant by CT FFR (0.88).   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  He has generally been healthy his whole life and is physically active.  He walks every day roughly 3 miles in 1 hour.  And more recently has added another 2 miles or so on a daily basis.  [place]  [place]  Occasionally while walking he does have some right parasternal discomfort but it is always very mild and resolves very promptly if he slows down his pace he does not have to stop and rest for it to go away.  He does not have exertional dyspnea, orthopnea, PND, lower extremity edema or claudication he denies any focal neurological events.   He has had a couple of episodes of tachycardia that he described as having very abrupt onset and very abrupt termination.  The spell sometimes associated mild lightheadedness and some chest discomfort.  In the past he has been able to terminate these episodes by coughing or by placing his hands in cold water.  The heart rate is around 140 bpm.  1 of these episodes did not resolve spontaneously and he went to the emergency room 04/20/2023 so went to the emergency room.  While he was waiting to be triaged the arrhythmia abruptly terminated and he remained asymptomatic during his evaluation, which showed benign findings (ECG was normal, normal sinus rhythm, high-sensitivity troponin negative, normal chest x-ray and routine labs).     He has an Scientist, physiological and has not been able to catch any one of the episodes of sustained tachycardia on a rhythm strip, but has shown periods where he has palpitations associated with very frequent PVCs occurring every 3-4 beats.   In 2019 he had an episode of syncope.  That episode was not associated with palpitations.  It happened when he had  had poor oral oral intake and had a beer to drink and smoked a cigar and sounded more vasovagal.  He was probably dehydrated at the time and he improved with IV fluids.  It remains the only episode of full-blown syncope he is ever had.   He underwent a coronary calcium score in June 2023 with an absolute score of 512, placing him in the 79th percentile for age and gender.  His father had sudden cardiac arrest at age 45 which she survived and subsequently had bypass surgery  and received a defibrillator.  His ejection fraction was severely decreased.  He eventually died of mesothelioma many years later.  The patient's cholesterol profile is excellent on statin therapy with an HDL of 61, LDL 62, triglycerides 66.  In the past he had a borderline hemoglobin A1c at 5.8%, but has never been declared to have full-blown diabetes.  He does not smoke cigarettes.  His blood pressure usually is in the 120/70 range.  He has erectile dysfunction which responds well to sildenafil.   Past Medical History:  Diagnosis Date   Asthma    Past Surgical History:  Procedure Laterality Date   COLONOSCOPY  2000   Buccini-normal   TONSILLECTOMY  1959     No outpatient medications have been marked as taking for the 08/06/23 encounter (Appointment) with Thurmon Fair, MD.     Allergies:   Patient has no known allergies.   Social History   Tobacco Use   Smoking status: Never  Vaping Use   Vaping status: Never Used  Substance Use Topics   Alcohol use: No   Drug use: Not Currently     Family Hx: The patient's family history includes Breast cancer in his maternal grandmother; Diabetes in his brother and paternal grandmother; Esophageal cancer in his paternal grandfather; Ovarian cancer in his mother; Stroke in his paternal grandmother. There is no history of Colon cancer, Colon polyps, or Stomach cancer.  ROS:   Please see the history of present illness.     All other systems reviewed and are negative.   Prior CV studies:   The following studies were reviewed today:  07/26/2023 Coronary CTA  Coronary calcium score: The patient's coronary artery calcium score is 468, which places the patient in the 75th percentile [facility]).   Coronary arteries: Normal coronary origins.  Right dominance.   Right Coronary Artery: Dominant. There is minimal mixed 1-24% proximal and distal stenosis. The R-PLB branch is normal. The proximal R-PDA branch has moderate to likely severe  stenosis (70-99%) at a 90 degree bend in the vessel.   Left Main Coronary Artery: Normal. Bifurcates into the LAD and LCX arteries.   Left Anterior Descending Coronary Artery: Heavy calcification of the ostial/proximal LAD with at least moderate stenosis (50-69%). Tortuous D1 branch with minimal mixed 1-24% proximal stenosis. Smaller D2 branch without disease.   Left Circumflex Artery: AV groove vessel with ostial/proximal mixed plaque with low attenuation features and (50-69%) mixed stenosis. The distal LCX is normal. Large OM branch with minimal 1-24% proximal non-calcified stenosis.   Aorta: Normal size, 35 mm at the mid ascending aorta (level of the PA bifurcation) measured double oblique. No calcifications. No dissection.   Aortic Valve: Trileaflet. No calcifications.   Other findings:   Normal pulmonary vein drainage into the left atrium.   Normal left atrial appendage without a thrombus.   Normal size of the pulmonary artery.   IMPRESSION: 1. 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  CT FFR will be performed and reported separately.   2. Coronary artery calcium score is 468, which places the patient in the 75th percentile for age/race and sex-matched control (MESA).   3. Normal coronary origin with right dominance.   4. Aggressive risk factor modification recommended. 1. CT FFR analysis does show significant discrete stenosis (<0.75) of the mid to distal LAD after the D1 branch.   2. The R-PDA demonstrates borderline significant stenosis (FFR = 0.77)   2. Recommend aggressive medical therapy and consider cardiac catheterization based on correlation with anginal symptoms.  Labs/Other Tests and Data Reviewed:    EKG:  EKG Interpretation Date/Time:                  Monday July 08 2023 15:41:41 EST Ventricular Rate:         71 PR Interval:                 158 QRS Duration:             86 QT Interval:                 390 QTC Calculation:423 R  Axis:                         -7   Text Interpretation:Normal sinus rhythm Normal ECG When compared with ECG of 20-Apr-2023 09:56, No significant change was found Confirmed by Wyndi Northrup [phone]) on 07/08/2023 3:52:37 PM      Recent Labs: 07/30/2023: BUN 11; Creatinine, Ser 0.81; Hemoglobin 16.3; Platelets 287; Potassium 4.2; Sodium 136   Recent Lipid Panel No results found for: "CHOL", "TRIG", "HDL", "CHOLHDL", "LDLCALC", "LDLDIRECT"  on statin therapy with an HDL of 61, LDL 62, triglycerides 6   Wt Readings from Last 3 Encounters:  07/08/23 185 lb 3.2 oz (84 kg)  04/20/23 180 lb (81.6 kg)  04/13/22 180 lb (81.6 kg)     Risk Assessment/Calculations:          Objective:    Vital Signs:  There were no vitals taken for this visit.     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  His coronary CTA describes an ostial-proximal LAD stenosis that appears to be significant by CT FFR, at least when measured beyond the first diagonal branch.  His lipid profile is well-controlled on statin and he does not have other modifiable major risk factors for coronary disease, but does have a family history of early onset severe CAD.  Recommend invasive coronary angiography and percutaneous revascularization if indicated.  He is scheduled for Friday, December 5 with Dr. Excell Seltzer. This procedure has been fully reviewed with the patient and written informed consent has been obtained. HLP: Excellent lipid profile on statin.  Continue.  Target LDL less than 70 has been achieved. Elevated BP: Usually his blood pressure is normal.  ED: Responds well to PDE 5.  On physical exam he has normal distal pulses, no other signs of significant PAD.  Aware that he should not take sildenafil 24 hours before the cardiac catheterization. 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  He has an Apple watch and hopefully if another episode recurs we will be able to documented to  using this device and send it to [place] via MyChart.  He tells me that his Apple Watch is also showed frequent PVCs, but these are not a frequent problem.  Start low-dose beta-blocker therapy with metoprolol succinate 25 mg once daily, but at this point there appears to be no reason to start more aggressive antiarrhythmic therapy, either pharmacological or ablation. 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  However, it has been stable on serial studies, most recently performed in April 2024, with a plan for follow-up in 2 years.         Informed Consent   Shared Decision Making/Informed Consent The risks [stroke (1 in 1000), death (1 in 1000), kidney failure [usually temporary] (1 in 500), bleeding (1 in 200), allergic reaction [possibly serious] (1 in 200)], benefits (diagnostic support and management of coronary artery disease) and alternatives of a cardiac catheterization were discussed in detail with Mr. Silvas and he is willing to proceed.        Time:   Today, I have spent 12 minutes with the patient with telehealth technology discussing the above problems.     Medication Adjustments/Labs and Tests Ordered: Current medicines are reviewed at length with the patient today.  Concerns regarding medicines are outlined above.   Tests Ordered: No orders of the defined types were placed in this encounter.   Medication Changes: No orders of the defined types were placed in this encounter.     Signed, Thurmon Fair, MD  08/06/2023 8:28 AM    [facility]

## 2023-08-08 ENCOUNTER — Telehealth: Payer: Self-pay | Admitting: *Deleted

## 2023-08-08 NOTE — Telephone Encounter (Signed)
Cardiac Catheterization scheduled at [facility] for: Friday August 09, 2023 10:30 AM Arrival time [facility] Main Entrance A at: 8:30 AM  Nothing to eat after midnight prior to procedure, clear liquids until 5 AM day of procedure.  Medication instructions: -Usual morning medications can be taken with sips of water including aspirin 81 mg.  Plan to go home the same day, you will only stay overnight if medically necessary.  You must have responsible adult to drive you home.  Someone must be with you the first 24 hours after you arrive home.  Left message for patient to call back to review procedure instructions

## 2023-08-08 NOTE — Telephone Encounter (Signed)
No answer, voicemail message. 

## 2023-08-09 ENCOUNTER — Ambulatory Visit (HOSPITAL_COMMUNITY)
Admission: RE | Admit: 2023-08-09 | Discharge: 2023-08-09 | Disposition: A | Discharge status: Home / Self Care | Payer: Medicare Other | Attending: Cardiovascular Disease | Admitting: Cardiovascular Disease

## 2023-08-09 ENCOUNTER — Ambulatory Visit (HOSPITAL_COMMUNITY): Payer: Medicare Other

## 2023-08-09 ENCOUNTER — Other Ambulatory Visit: Payer: Self-pay

## 2023-08-09 ENCOUNTER — Encounter (HOSPITAL_COMMUNITY)
Admission: RE | Disposition: A | Discharge status: Home / Self Care | Payer: Self-pay | Source: Home / Self Care | Attending: Cardiovascular Disease

## 2023-08-09 ENCOUNTER — Telehealth: Payer: Self-pay | Admitting: Emergency Medicine

## 2023-08-09 DIAGNOSIS — I2584 Coronary atherosclerosis due to calcified coronary lesion: Secondary | ICD-10-CM | POA: Insufficient documentation

## 2023-08-09 DIAGNOSIS — K862 Cyst of pancreas: Secondary | ICD-10-CM | POA: Insufficient documentation

## 2023-08-09 DIAGNOSIS — Z79899 Other long term (current) drug therapy: Secondary | ICD-10-CM | POA: Diagnosis not present

## 2023-08-09 DIAGNOSIS — E7800 Pure hypercholesterolemia, unspecified: Secondary | ICD-10-CM | POA: Diagnosis not present

## 2023-08-09 DIAGNOSIS — N529 Male erectile dysfunction, unspecified: Secondary | ICD-10-CM | POA: Insufficient documentation

## 2023-08-09 DIAGNOSIS — I2510 Atherosclerotic heart disease of native coronary artery without angina pectoris: Secondary | ICD-10-CM

## 2023-08-09 DIAGNOSIS — I25118 Atherosclerotic heart disease of native coronary artery with other forms of angina pectoris: Secondary | ICD-10-CM | POA: Diagnosis present

## 2023-08-09 DIAGNOSIS — Z8249 Family history of ischemic heart disease and other diseases of the circulatory system: Secondary | ICD-10-CM | POA: Diagnosis not present

## 2023-08-09 HISTORY — DX: Atherosclerotic heart disease of native coronary artery without angina pectoris: I25.10

## 2023-08-09 HISTORY — PX: LEFT HEART CATH AND CORONARY ANGIOGRAPHY: CATH118249

## 2023-08-09 LAB — ECHOCARDIOGRAM COMPLETE
Area-P 1/2: 2.77 cm2
Height: 71 in
S' Lateral: 3.4 cm
Weight: 2880 [oz_av]

## 2023-08-09 SURGERY — LEFT HEART CATH AND CORONARY ANGIOGRAPHY
Anesthesia: LOCAL

## 2023-08-09 MED ORDER — HEPARIN SODIUM (PORCINE) 1000 UNIT/ML IJ SOLN
INTRAMUSCULAR | Status: AC
  Filled 2023-08-09: qty 10

## 2023-08-09 MED ORDER — ASPIRIN 81 MG PO CHEW: 81.0000 mg | CHEWABLE_TABLET | ORAL | Status: DC

## 2023-08-09 MED ORDER — SODIUM CHLORIDE 0.9 % WEIGHT BASED INFUSION: 1.0000 mL/kg/h | INTRAVENOUS | Status: DC

## 2023-08-09 MED ORDER — FENTANYL CITRATE (PF) 100 MCG/2ML IJ SOLN
INTRAMUSCULAR | Status: DC | PRN
  Administered 2023-08-09: 25 ug via INTRAVENOUS

## 2023-08-09 MED ORDER — ACETAMINOPHEN 325 MG PO TABS: 650.0000 mg | ORAL_TABLET | ORAL | Status: DC | PRN

## 2023-08-09 MED ORDER — METOPROLOL SUCCINATE ER 50 MG PO TB24: 50.0000 mg | ORAL_TABLET | Freq: Every day | ORAL | 3 refills | Status: DC

## 2023-08-09 MED ORDER — HEPARIN SODIUM (PORCINE) 1000 UNIT/ML IJ SOLN
INTRAMUSCULAR | Status: DC | PRN
  Administered 2023-08-09: 4000 [IU] via INTRAVENOUS

## 2023-08-09 MED ORDER — VERAPAMIL HCL 2.5 MG/ML IV SOLN
INTRAVENOUS | Status: DC | PRN
  Administered 2023-08-09: 10 mL via INTRA_ARTERIAL

## 2023-08-09 MED ORDER — HYDRALAZINE HCL 20 MG/ML IJ SOLN: 10.0000 mg | INTRAMUSCULAR | Status: DC | PRN

## 2023-08-09 MED ORDER — HEPARIN (PORCINE) IN NACL 1000-0.9 UT/500ML-% IV SOLN
INTRAVENOUS | Status: DC | PRN
  Administered 2023-08-09 (×2): 500 mL

## 2023-08-09 MED ORDER — LABETALOL HCL 5 MG/ML IV SOLN: 10.0000 mg | INTRAVENOUS | Status: DC | PRN

## 2023-08-09 MED ORDER — FENTANYL CITRATE (PF) 100 MCG/2ML IJ SOLN
INTRAMUSCULAR | Status: AC
  Filled 2023-08-09: qty 2

## 2023-08-09 MED ORDER — SODIUM CHLORIDE 0.9% FLUSH
3.0000 mL | Freq: Two times a day (BID) | INTRAVENOUS | Status: DC
Start: 2023-08-09 — End: 2023-08-09

## 2023-08-09 MED ORDER — VERAPAMIL HCL 2.5 MG/ML IV SOLN
INTRAVENOUS | Status: AC
  Filled 2023-08-09: qty 2

## 2023-08-09 MED ORDER — MIDAZOLAM HCL 2 MG/2ML IJ SOLN
INTRAMUSCULAR | Status: AC
Start: 2023-08-09 — End: ?
  Filled 2023-08-09: qty 2

## 2023-08-09 MED ORDER — ONDANSETRON HCL 4 MG/2ML IJ SOLN: 4.0000 mg | Freq: Four times a day (QID) | INTRAMUSCULAR | Status: DC | PRN

## 2023-08-09 MED ORDER — LIDOCAINE HCL (PF) 1 % IJ SOLN
INTRAMUSCULAR | Status: AC
  Filled 2023-08-09: qty 30

## 2023-08-09 MED ORDER — SODIUM CHLORIDE 0.9 % WEIGHT BASED INFUSION: 3.0000 mL/kg/h | INTRAVENOUS | Status: AC

## 2023-08-09 MED ORDER — SODIUM CHLORIDE 0.9 % IV SOLN: INTRAVENOUS | Status: AC

## 2023-08-09 MED ORDER — SODIUM CHLORIDE 0.9 % WEIGHT BASED INFUSION
3.0000 mL/kg/h | INTRAVENOUS | Status: DC
  Administered 2023-08-09: 3 mL/kg/h via INTRAVENOUS

## 2023-08-09 MED ORDER — SODIUM CHLORIDE 0.9% FLUSH: 3.0000 mL | INTRAVENOUS | Status: DC | PRN

## 2023-08-09 MED ORDER — MIDAZOLAM HCL 2 MG/2ML IJ SOLN
INTRAMUSCULAR | Status: DC | PRN
  Administered 2023-08-09: 1 mg via INTRAVENOUS

## 2023-08-09 MED ORDER — LIDOCAINE HCL (PF) 1 % IJ SOLN
INTRAMUSCULAR | Status: DC | PRN
  Administered 2023-08-09: 2 mL

## 2023-08-09 MED ORDER — SODIUM CHLORIDE 0.9 % IV SOLN: 250.0000 mL | INTRAVENOUS | Status: DC | PRN

## 2023-08-09 MED ORDER — IOHEXOL 350 MG/ML SOLN
INTRAVENOUS | Status: DC | PRN
  Administered 2023-08-09: 45 mL

## 2023-08-09 SURGICAL SUPPLY — 7 items
CATH 5FR JL3.5 JR4 ANG PIG MP (CATHETERS) IMPLANT
DEVICE RAD COMP TR BAND LRG (VASCULAR PRODUCTS) IMPLANT
GLIDESHEATH SLEND SS 6F .021 (SHEATH) IMPLANT
GUIDEWIRE INQWIRE 1.5J.035X260 (WIRE) IMPLANT
INQWIRE 1.5J .035X260CM (WIRE) ×1
PACK CARDIAC CATHETERIZATION (CUSTOM PROCEDURE TRAY) ×1 IMPLANT
SET ATX-X65L (MISCELLANEOUS) IMPLANT

## 2023-08-09 NOTE — Interval H&P Note (Signed)
History and Physical Interval Note:  08/09/2023 11:56 AM  Rickey Lucas  has presented today for surgery, with the diagnosis of abnormal ct - unstable angina.  The various methods of treatment have been discussed with the patient and family. After consideration of risks, benefits and other options for treatment, the patient has consented to  Procedure(s): LEFT HEART CATH AND CORONARY ANGIOGRAPHY (N/A) as a surgical intervention.  The patient's history has been reviewed, patient examined, no change in status, stable for surgery.  I have reviewed the patient's chart and labs.  Questions were answered to the patient's satisfaction.     [name]

## 2023-08-09 NOTE — Discharge Instructions (Signed)

## 2023-08-09 NOTE — Telephone Encounter (Signed)
Croitoru, Rachelle Hora, MD  Scheryl Marten, RN Please send in a new prescription for metoprolol succinate 50 mg daily, which he will take long term.  RX sent  MyChart message sent to patient

## 2023-08-09 NOTE — Progress Notes (Signed)
  Echocardiogram 2D Echocardiogram has been performed.  Delcie Roch 08/09/2023, 5:03 PM

## 2023-08-12 ENCOUNTER — Encounter (HOSPITAL_COMMUNITY): Payer: Self-pay | Admitting: Cardiovascular Disease

## 2023-08-12 ENCOUNTER — Other Ambulatory Visit (HOSPITAL_COMMUNITY): Payer: Medicare Other

## 2023-08-12 ENCOUNTER — Institutional Professional Consult (permissible substitution) (INDEPENDENT_AMBULATORY_CARE_PROVIDER_SITE_OTHER): Payer: Medicare Other | Admitting: Surgery

## 2023-08-12 ENCOUNTER — Other Ambulatory Visit: Payer: Self-pay | Admitting: *Deleted

## 2023-08-12 VITALS — BP 189/99 | HR 73 | Resp 18 | Ht 71.0 in | Wt 183.0 lb

## 2023-08-12 DIAGNOSIS — I2510 Atherosclerotic heart disease of native coronary artery without angina pectoris: Secondary | ICD-10-CM

## 2023-08-12 DIAGNOSIS — R9439 Abnormal result of other cardiovascular function study: Secondary | ICD-10-CM

## 2023-08-12 DIAGNOSIS — R7989 Other specified abnormal findings of blood chemistry: Secondary | ICD-10-CM

## 2023-08-12 DIAGNOSIS — Z5181 Encounter for therapeutic drug level monitoring: Secondary | ICD-10-CM

## 2023-08-12 NOTE — Progress Notes (Signed)
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  The first diagonal branch had an FFR of 0.77.  The left main was not felt to have any significant stenosis with an FFR of 0.99.  The left circumflex had no significant stenosis with a proximal FFR of 0.88 and a distal FFR 0.85.  The RCA had a borderline significant stenosis with an FFR in the PDA of 0.77.  He subsequently underwent cardiac catheterization showing a 75% distal left main stenosis.  There was a long 70 to 75% severely calcified proximal LAD stenosis.  Left circumflex was a large vessel with no significant disease and a large graftable OM.  The RCA was a large dominant vessel with mild nonobstructive plaque.  LVEDP was normal.  2D echocardiogram showed an ejection fraction of 60 to 65% with grade 1 diastolic dysfunction.  There is no significant valvular disease.  He reports that he walks several miles every day and occasionally has some substernal chest discomfort that resolves with rest.  He also reports having some episodes without activity.  He said that he feels more aware of the discomfort now that he has the diagnosis.  He has had a few episodes of tachycardia with some lightheadedness.  He had an episode of syncope in 2019 that was felt to be most likely vasovagal.  His father suffered a cardiac arrest at age 9 near a fire station and was successfully resuscitated but had a low ejection fraction and ICD and lived  another 10 years.  Past Medical History:  Diagnosis Date   Asthma     Past Surgical History:  Procedure Laterality Date   COLONOSCOPY  2000   Buccini-normal   LEFT HEART CATH AND CORONARY ANGIOGRAPHY N/A 08/09/2023   Procedure: LEFT HEART CATH AND CORONARY ANGIOGRAPHY;  Surgeon: [name], MD;  Location: [facility] INVASIVE CV LAB;  Service: Cardiovascular;  Laterality: N/A;   TONSILLECTOMY  1959    Family History  Problem Relation Age of Onset   Ovarian cancer Mother    Diabetes Brother    Breast cancer Maternal Grandmother    Diabetes Paternal Grandmother    Stroke Paternal Grandmother    Esophageal cancer Paternal Grandfather    Colon cancer Neg Hx    Colon polyps Neg Hx    Stomach cancer Neg Hx     Social History Social History   Tobacco Use   Smoking status: Never  Vaping Use   Vaping status: Never Used  Substance Use Topics   Alcohol use: No   Drug use: Not Currently    Current Outpatient Medications  Medication Sig Dispense Refill   albuterol (PROAIR HFA) 108 (90 Base) MCG/ACT inhaler Inhale 2 puffs into the lungs every 6 (six) hours as needed for wheezing or shortness of breath.     Ascorbic Acid (VITAMIN C) 1000 MG tablet Take 500 mg by mouth 2 (two) times daily.     aspirin EC 81 MG tablet Take 1  tablet (81 mg total) by mouth daily. Swallow whole.     Cholecalciferol (VITAMIN D-3) 125 MCG (5000 UT) TABS Take 5,000 Units by mouth every other day.     Coenzyme Q10 100 MG capsule Take 100 mg by mouth daily.     loratadine (CLARITIN) 10 MG tablet Take 10 mg by mouth daily as needed (allergies).     metoprolol succinate (TOPROL XL) 50 MG 24 hr tablet Take 1 tablet (50 mg total) by mouth daily. 90 tablet 3   Multiple Vitamin (MULTIVITAMIN) tablet Take 1 tablet by mouth daily.     rosuvastatin (CRESTOR) 10 MG tablet Take 10 mg by mouth daily.     sildenafil (REVATIO) 20 MG tablet Take 20 mg by mouth daily as needed.     TURMERIC PO Take 500 mg by mouth daily at 12  noon.     vitamin E 180 MG (400 UNITS) capsule Take 400 Units by mouth 2 (two) times daily.     No current facility-administered medications for this visit.    No Known Allergies  Review of Systems  Constitutional:  Negative for activity change and fatigue.  HENT: Negative.    Eyes: Negative.   Respiratory:  Positive for chest tightness and shortness of breath.   Cardiovascular:  Positive for chest pain and palpitations. Negative for leg swelling.  Gastrointestinal: Negative.   Endocrine: Negative.   Genitourinary: Negative.   Musculoskeletal: Negative.   Skin: Negative.   Neurological:  Positive for dizziness.  Hematological: Negative.   Psychiatric/Behavioral: Negative.      BP (!) 189/99 (BP Location: Left Arm, Patient Position: Sitting)   Pulse 73   Resp 18   Ht 5\' 11"  (1.803 m)   Wt 183 lb (83 kg)   SpO2 97% Comment: RA  BMI 25.52 kg/m  Physical Exam Constitutional:      Appearance: Normal appearance. He is normal weight.  HENT:     Head: Normocephalic and atraumatic.  Eyes:     Extraocular Movements: Extraocular movements intact.     Conjunctiva/sclera: Conjunctivae normal.     Pupils: Pupils are equal, round, and reactive to light.  Cardiovascular:     Rate and Rhythm: Normal rate and regular rhythm.     Pulses: Normal pulses.     Heart sounds: Normal heart sounds. No murmur heard. Pulmonary:     Effort: Pulmonary effort is normal.     Breath sounds: Normal breath sounds.  Musculoskeletal:        General: No swelling.  Skin:    General: Skin is warm and dry.  Neurological:     General: No focal deficit present.     Mental Status: He is alert and oriented to person, place, and time.  Psychiatric:        Mood and Affect: Mood normal.        Behavior: Behavior normal.        Thought Content: Thought content normal.        Judgment: Judgment normal.      Diagnostic Tests:  rocedures  LEFT HEART CATH AND CORONARY ANGIOGRAPHY   Conclusion       Mid LM to Dist LM lesion is 75% stenosed.   Prox LAD to Mid LAD lesion is 70% stenosed.   Prox RCA lesion is 30% stenosed.   RPAV lesion is 50% stenosed.   Severe non-calcified distal left main stenosis of 75% Severe calcified proximal LAD stenosis of 70-75% Patent LCx with large, graftable OM Large,  dominant RCA with mild nonobstructive plaquing Normal LVEDP   Recommendations: Outpatient cardiac surgical evaluation for CABG. Advised to avoid strenuous activity. Otherwise, continue current medical therapy.   Indications  Exertional angina (HCC) [I20.89 (ICD-10-CM)]   Procedural Details  Technical Details INDICATION: abnormal coronary CTA with hemodynamically significant proximal LAD stenosis and atypical exertional angina  PROCEDURAL DETAILS: The right wrist is prepped, draped, and anesthetized with 1% lidocaine. Using the modified Seldinger technique, a 5/6 French Slender sheath is introduced into the right radial artery. 3 mg of verapamil is administered through the sheath, weight-based unfractionated heparin was administered intravenously. Standard Judkins catheters are used for selective coronary angiography. LV pressure is recorded and an aortic valve pullback gradient is measured.  Catheter exchanges are performed over an exchange length guidewire. There are no immediate procedural complications. A TR band is used for radial hemostasis at the completion of the procedure.  The patient was transferred to the post catheterization recovery area for further monitoring.       Estimated blood loss <50 mL.   During this procedure medications were administered to achieve and maintain moderate conscious sedation while the patient's heart rate, blood pressure, and oxygen saturation were continuously monitored and I was present face-to-face 100% of this time.   Medications (Filter: Administrations occurring from 1204 to 1252 on 08/09/23) midazolam (VERSED) injection (mg)  Total dose: 1  mg Date/Time Rate/Dose/Volume Action   08/09/23 1218 1 mg Given   fentaNYL (SUBLIMAZE) injection (mcg)  Total dose: 25 mcg Date/Time Rate/Dose/Volume Action   08/09/23 1218 25 mcg Given   Heparin (Porcine) in NaCl 1000-0.9 UT/500ML-% SOLN (mL)  Total volume: 1,000 mL Date/Time Rate/Dose/Volume Action   08/09/23 1220 500 mL Given   1220 500 mL Given   Radial Cocktail/Verapamil only (mL)  Total volume: 10 mL Date/Time Rate/Dose/Volume Action   08/09/23 1231 10 mL Given   heparin sodium (porcine) injection (Units)  Total dose: 4,000 Units Date/Time Rate/Dose/Volume Action   08/09/23 1232 4,000 Units Given   iohexol (OMNIPAQUE) 350 MG/ML injection (mL)  Total volume: 45 mL Date/Time Rate/Dose/Volume Action   08/09/23 1246 45 mL Given   lidocaine (PF) (XYLOCAINE) 1 % injection (mL)  Total volume: 2 mL Date/Time Rate/Dose/Volume Action   08/09/23 1231 2 mL Given    Sedation Time  Sedation Time Physician-1: 22 minutes 3 seconds Contrast     Administrations occurring from 1204 to 1252 on 08/09/23:  Medication Name Total Dose  iohexol (OMNIPAQUE) 350 MG/ML injection 45 mL   Radiation/Fluoro  Fluoro time: 3.3 (min) DAP: 12743 (mGycm2) Cumulative Air Kerma: 254 (mGy) Complications  Complications documented before study signed (08/09/2023  1:03 PM)   No complications were associated with this study.  Documented by Argie Ramming, RN - 08/09/2023 12:41 PM     Coronary Findings  Diagnostic Dominance: Right Left Main  Mid LM to Dist LM lesion is 75% stenosed. The lesion is eccentric.    Left Anterior Descending  Prox LAD to Mid LAD lesion is 70% stenosed. The lesion is calcified.    Left Circumflex  The vessel exhibits minimal luminal irregularities.    Right Coronary Artery  There is mild diffuse disease throughout the vessel.  Prox RCA lesion is 30% stenosed.    Right Posterior Atrioventricular Artery  RPAV lesion is 50% stenosed.    Intervention   No  interventions have been documented.   Coronary Diagrams  Diagnostic Dominance: Right  Intervention   Implants   No implant documentation for  this case.   Syngo Images   Show images for CARDIAC CATHETERIZATION Images on Long Term Storage   Show images for Riyan, Faron to Procedure Log  Procedure Log   Link to Procedure Log  Procedure Log    Hemo Data  Flowsheet Row Most Recent Value  AO Systolic Pressure 134 mmHg  AO Diastolic Pressure 78 mmHg  AO Mean 103 mmHg  LV Systolic Pressure 125 mmHg  LV Diastolic Pressure 5 mmHg  LV EDP 11 mmHg  AOp Systolic Pressure 143 mmHg  AOp Diastolic Pressure 84 mmHg  AOp Mean Pressure 108 mmHg  LVp Systolic Pressure 140 mmHg  LVp Diastolic Pressure 5 mmHg  LVp EDP Pressure 7 mmHg    ECHOCARDIOGRAM REPORT       Patient Name:   Rickey Lucas Date of Exam: 08/09/2023  Medical Rec #:  [id number]           Height:       71.0 in  Accession #:    [id number]          Weight:       180.0 lb  Date of Birth:  1955-12-25            BSA:          2.016 m  Patient Age:    67 years            BP:           132/72 mmHg  Patient Gender: M                   HR:           60 bpm.  Exam Location:  Outpatient   Procedure: 2D Echo, Color Doppler and Cardiac Doppler   Indications:    CAD of native vessel    History:        Patient has no prior history of Echocardiogram  examinations.                 Risk Factors:Family History of Coronary Artery Disease and                  Dyslipidemia.    Sonographer:    Delcie Roch RDCS  Referring Phys: [phone] MICHAEL COOPER   IMPRESSIONS     1. Left ventricular ejection fraction, by estimation, is 60 to 65%. The  left ventricle has normal function. The left ventricle has no regional  wall motion abnormalities. Left ventricular diastolic parameters are  consistent with Grade I diastolic  dysfunction (impaired relaxation). The average left ventricular global  longitudinal strain is  -18.0 %. The global longitudinal strain is normal.   2. Right ventricular systolic function is normal. The right ventricular  size is normal. Tricuspid regurgitation signal is inadequate for assessing  PA pressure.   3. The mitral valve is grossly normal. Trivial mitral valve  regurgitation.   4. The aortic valve is tricuspid. Aortic valve regurgitation is not  visualized.   5. The inferior vena cava is normal in size with greater than 50%  respiratory variability, suggesting right atrial pressure of 3 mmHg.   FINDINGS   Left Ventricle: Left ventricular ejection fraction, by estimation, is 60  to 65%. The left ventricle has normal function. The left ventricle has no  regional wall motion abnormalities. The average left ventricular global  longitudinal strain is -18.0 %.  The global longitudinal strain is normal. The left ventricular internal  cavity size was normal in size. There is no left ventricular hypertrophy.  Left ventricular diastolic parameters are consistent with Grade I  diastolic dysfunction (impaired  relaxation).   Right Ventricle: The right ventricular size is normal. Right ventricular  systolic function is normal. Tricuspid regurgitation signal is inadequate  for assessing PA pressure.   Left Atrium: Left atrial size was normal in size.   Right Atrium: Right atrial size was normal in size.   Pericardium: There is no evidence of pericardial effusion.   Mitral Valve: The mitral valve is grossly normal. Trivial mitral valve  regurgitation.   Tricuspid Valve: Tricuspid valve regurgitation is not demonstrated.   Aortic Valve: The aortic valve is tricuspid. Aortic valve regurgitation is  not visualized.   Pulmonic Valve: Pulmonic valve regurgitation is not visualized.   Aorta: The aortic root and ascending aorta are structurally normal, with  no evidence of dilitation.   Venous: The inferior vena cava is normal in size with greater than 50%  respiratory  variability, suggesting right atrial pressure of 3 mmHg.   IAS/Shunts: No atrial level shunt detected by color flow Doppler.     LEFT VENTRICLE  PLAX 2D  LVIDd:         5.10 cm   Diastology  LVIDs:         3.40 cm   LV e' medial:    8.16 cm/s  LV PW:         1.00 cm   LV E/e' medial:  6.9  LV IVS:        0.70 cm   LV e' lateral:   10.80 cm/s  LVOT diam:     1.90 cm   LV E/e' lateral: 5.2  LV SV:         52  LV SV Index:   26        2D Longitudinal Strain  LVOT Area:     2.84 cm  2D Strain GLS Avg:     -18.0 %     RIGHT VENTRICLE             IVC  RV Basal diam:  2.60 cm     IVC diam: 1.50 cm  RV S prime:     11.20 cm/s  TAPSE (M-mode): 1.9 cm   LEFT ATRIUM             Index        RIGHT ATRIUM           Index  LA diam:        3.20 cm 1.59 cm/m   RA Area:     13.90 cm  LA Vol (A2C):   34.6 ml 17.16 ml/m  RA Volume:   33.30 ml  16.52 ml/m  LA Vol (A4C):   40.3 ml 19.99 ml/m  LA Biplane Vol: 40.9 ml 20.29 ml/m   AORTIC VALVE  LVOT Vmax:   90.30 cm/s  LVOT Vmean:  55.600 cm/s  LVOT VTI:    0.183 m    AORTA  Ao Root diam: 3.20 cm  Ao Asc diam:  2.90 cm   MITRAL VALVE  MV Area (PHT): 2.77 cm    SHUNTS  MV Decel Time: 274 msec    Systemic VTI:  0.18 m  MV E velocity: 56.30 cm/s  Systemic Diam: 1.90 cm  MV A velocity: 78.80 cm/s  MV E/A ratio:  0.71   Actuary signed by Carolan Clines  Signature Date/Time: 08/09/2023/5:16:04  PM        Final       Impression:  This 67 year old gentleman has severe left main and proximal LAD stenosis with crescendo anginal symptoms.  I agree that coronary artery bypass graft surgery is the best treatment for resolution of his symptoms and to prevent myocardial loss and sudden death.  This will require bypass to the LAD, first diagonal, and obtuse marginal branch.  I reviewed the cardiac catheterization films with the patient and answered his questions. I discussed the operative procedure with the patient including  alternatives, benefits and risks; including but not limited to bleeding, blood transfusion, infection, stroke, myocardial infarction, graft failure, heart block requiring a permanent pacemaker, organ dysfunction, and death.  Rickey Lucas understands and agrees to proceed.    Plan:  Coronary bypass graft surgery on Friday, 08/16/2023.   I spent 20 minutes performing this consultation and > 50% of this time was spent face to face counseling and coordinating the care of this patient's multivessel coronary disease.   Alleen Borne, MD [facility] [phone]

## 2023-08-13 ENCOUNTER — Inpatient Hospital Stay (HOSPITAL_COMMUNITY)
Admission: EM | Admit: 2023-08-13 | Discharge: 2023-08-21 | DRG: 236 | Disposition: A | Discharge status: Home / Self Care | Payer: Medicare Other | Attending: Surgery | Admitting: Surgery

## 2023-08-13 ENCOUNTER — Emergency Department (HOSPITAL_COMMUNITY): Payer: Medicare Other

## 2023-08-13 ENCOUNTER — Encounter (HOSPITAL_COMMUNITY): Payer: Self-pay

## 2023-08-13 ENCOUNTER — Other Ambulatory Visit: Payer: Self-pay | Admitting: *Deleted

## 2023-08-13 ENCOUNTER — Other Ambulatory Visit: Payer: Self-pay

## 2023-08-13 DIAGNOSIS — Z803 Family history of malignant neoplasm of breast: Secondary | ICD-10-CM | POA: Diagnosis not present

## 2023-08-13 DIAGNOSIS — I25110 Atherosclerotic heart disease of native coronary artery with unstable angina pectoris: Secondary | ICD-10-CM | POA: Diagnosis present

## 2023-08-13 DIAGNOSIS — Z7982 Long term (current) use of aspirin: Secondary | ICD-10-CM

## 2023-08-13 DIAGNOSIS — E785 Hyperlipidemia, unspecified: Secondary | ICD-10-CM | POA: Diagnosis present

## 2023-08-13 DIAGNOSIS — Z79899 Other long term (current) drug therapy: Secondary | ICD-10-CM | POA: Diagnosis not present

## 2023-08-13 DIAGNOSIS — I200 Unstable angina: Secondary | ICD-10-CM | POA: Diagnosis present

## 2023-08-13 DIAGNOSIS — Z8241 Family history of sudden cardiac death: Secondary | ICD-10-CM

## 2023-08-13 DIAGNOSIS — I959 Hypotension, unspecified: Secondary | ICD-10-CM | POA: Diagnosis not present

## 2023-08-13 DIAGNOSIS — I10 Essential (primary) hypertension: Secondary | ICD-10-CM | POA: Diagnosis present

## 2023-08-13 DIAGNOSIS — Z800 Family history of malignant neoplasm of digestive organs: Secondary | ICD-10-CM | POA: Diagnosis not present

## 2023-08-13 DIAGNOSIS — K862 Cyst of pancreas: Secondary | ICD-10-CM | POA: Diagnosis present

## 2023-08-13 DIAGNOSIS — Z823 Family history of stroke: Secondary | ICD-10-CM

## 2023-08-13 DIAGNOSIS — Z8249 Family history of ischemic heart disease and other diseases of the circulatory system: Secondary | ICD-10-CM | POA: Diagnosis not present

## 2023-08-13 DIAGNOSIS — Z951 Presence of aortocoronary bypass graft: Secondary | ICD-10-CM

## 2023-08-13 DIAGNOSIS — R55 Syncope and collapse: Secondary | ICD-10-CM | POA: Diagnosis not present

## 2023-08-13 DIAGNOSIS — J45909 Unspecified asthma, uncomplicated: Secondary | ICD-10-CM | POA: Diagnosis present

## 2023-08-13 DIAGNOSIS — E871 Hypo-osmolality and hyponatremia: Secondary | ICD-10-CM | POA: Diagnosis present

## 2023-08-13 DIAGNOSIS — Z833 Family history of diabetes mellitus: Secondary | ICD-10-CM | POA: Diagnosis not present

## 2023-08-13 DIAGNOSIS — K5900 Constipation, unspecified: Secondary | ICD-10-CM | POA: Diagnosis not present

## 2023-08-13 DIAGNOSIS — Z01810 Encounter for preprocedural cardiovascular examination: Secondary | ICD-10-CM | POA: Diagnosis not present

## 2023-08-13 DIAGNOSIS — I2510 Atherosclerotic heart disease of native coronary artery without angina pectoris: Secondary | ICD-10-CM | POA: Diagnosis not present

## 2023-08-13 HISTORY — DX: Family history of ischemic heart disease and other diseases of the circulatory system: Z82.49

## 2023-08-13 HISTORY — DX: Hyperlipidemia, unspecified: E78.5

## 2023-08-13 HISTORY — DX: Cyst of pancreas: K86.2

## 2023-08-13 LAB — CBC
HCT: 46 % (ref 39.0–52.0)
Hemoglobin: 15.6 g/dL (ref 13.0–17.0)
MCH: 32.2 pg (ref 26.0–34.0)
MCHC: 33.9 g/dL (ref 30.0–36.0)
MCV: 94.8 fL (ref 80.0–100.0)
Platelets: 286 10*3/uL (ref 150–400)
RBC: 4.85 MIL/uL (ref 4.22–5.81)
RDW: 13.1 % (ref 11.5–15.5)
WBC: 7.6 10*3/uL (ref 4.0–10.5)
nRBC: 0 % (ref 0.0–0.2)

## 2023-08-13 LAB — BASIC METABOLIC PANEL
Anion gap: 10 (ref 5–15)
BUN: 12 mg/dL (ref 8–23)
CO2: 22 mmol/L (ref 22–32)
Calcium: 8.8 mg/dL — ABNORMAL LOW (ref 8.9–10.3)
Chloride: 96 mmol/L — ABNORMAL LOW (ref 98–111)
Creatinine, Ser: 0.95 mg/dL (ref 0.61–1.24)
GFR, Estimated: >60 mL/min (ref >60)
Glucose, Bld: 106 mg/dL — ABNORMAL HIGH (ref 70–99)
Potassium: 4.2 mmol/L (ref 3.5–5.1)
Sodium: 128 mmol/L — ABNORMAL LOW (ref 135–145)

## 2023-08-13 LAB — HEPARIN LEVEL (UNFRACTIONATED)
Heparin Unfractionated: <0.1 [IU]/mL — ABNORMAL LOW (ref 0.30–0.70)
Heparin Unfractionated: 0.25 [IU]/mL — ABNORMAL LOW (ref 0.30–0.70)

## 2023-08-13 LAB — HIV ANTIBODY (ROUTINE TESTING W REFLEX): HIV Screen 4th Generation wRfx: NONREACTIVE

## 2023-08-13 LAB — TROPONIN I (HIGH SENSITIVITY)
Troponin I (High Sensitivity): 5 ng/L (ref <18)
Troponin I (High Sensitivity): 6 ng/L (ref <18)

## 2023-08-13 MED ORDER — VITAMIN E 45 MG (100 UNIT) PO CAPS
400.0000 [IU] | ORAL_CAPSULE | Freq: Two times a day (BID) | ORAL | Status: DC
  Administered 2023-08-14 – 2023-08-15 (×4): 400 [IU] via ORAL
  Filled 2023-08-13 (×7): qty 4

## 2023-08-13 MED ORDER — ASPIRIN 81 MG PO TBEC
81.0000 mg | DELAYED_RELEASE_TABLET | Freq: Every day | ORAL | Status: DC
  Administered 2023-08-14 – 2023-08-15 (×2): 81 mg via ORAL
  Filled 2023-08-13 (×3): qty 1

## 2023-08-13 MED ORDER — HEPARIN BOLUS VIA INFUSION
1000.0000 [IU] | Freq: Once | INTRAVENOUS | Status: AC
  Administered 2023-08-13: 1000 [IU] via INTRAVENOUS
  Filled 2023-08-13: qty 1000

## 2023-08-13 MED ORDER — NITROGLYCERIN 0.4 MG SL SUBL: 0.4000 mg | SUBLINGUAL_TABLET | SUBLINGUAL | Status: DC | PRN

## 2023-08-13 MED ORDER — ADULT MULTIVITAMIN W/MINERALS CH
1.0000 | ORAL_TABLET | Freq: Every day | ORAL | Status: DC
  Administered 2023-08-13 – 2023-08-15 (×3): 1 via ORAL
  Filled 2023-08-13 (×3): qty 1

## 2023-08-13 MED ORDER — TURMERIC 500 MG PO CAPS: 500.0000 mg | ORAL_CAPSULE | Freq: Every day | ORAL | Status: DC

## 2023-08-13 MED ORDER — HEPARIN (PORCINE) 25000 UT/250ML-% IV SOLN
1250.0000 [IU]/h | INTRAVENOUS | Status: DC
  Administered 2023-08-13: 1100 [IU]/h via INTRAVENOUS
  Administered 2023-08-15: 1250 [IU]/h via INTRAVENOUS
  Filled 2023-08-13 (×4): qty 250

## 2023-08-13 MED ORDER — NITROGLYCERIN 0.4 MG SL SUBL
0.4000 mg | SUBLINGUAL_TABLET | SUBLINGUAL | Status: DC | PRN
  Administered 2023-08-14 – 2023-08-15 (×2): 0.4 mg via SUBLINGUAL
  Filled 2023-08-13 (×3): qty 1

## 2023-08-13 MED ORDER — ALPRAZOLAM 0.25 MG PO TABS: 0.2500 mg | ORAL_TABLET | Freq: Two times a day (BID) | ORAL | Status: DC | PRN

## 2023-08-13 MED ORDER — ALBUTEROL SULFATE (2.5 MG/3ML) 0.083% IN NEBU: 3.0000 mL | INHALATION_SOLUTION | Freq: Four times a day (QID) | RESPIRATORY_TRACT | Status: DC | PRN

## 2023-08-13 MED ORDER — NITROGLYCERIN 2 % TD OINT
0.5000 [in_us] | TOPICAL_OINTMENT | Freq: Four times a day (QID) | TRANSDERMAL | Status: DC
  Administered 2023-08-13: 0.5 [in_us] via TOPICAL
  Filled 2023-08-13: qty 1

## 2023-08-13 MED ORDER — VITAMIN C 500 MG PO TABS
500.0000 mg | ORAL_TABLET | Freq: Two times a day (BID) | ORAL | Status: DC
  Administered 2023-08-13 – 2023-08-15 (×5): 500 mg via ORAL
  Filled 2023-08-13 (×5): qty 1

## 2023-08-13 MED ORDER — SODIUM CHLORIDE 0.9 % IV SOLN: 250.0000 mL | INTRAVENOUS | Status: AC | PRN

## 2023-08-13 MED ORDER — METOPROLOL SUCCINATE ER 50 MG PO TB24
50.0000 mg | ORAL_TABLET | Freq: Every day | ORAL | Status: DC
Start: 2023-08-14 — End: 2023-08-16
  Administered 2023-08-14 – 2023-08-15 (×2): 50 mg via ORAL
  Filled 2023-08-13 (×2): qty 1

## 2023-08-13 MED ORDER — ACETAMINOPHEN 325 MG PO TABS: 650.0000 mg | ORAL_TABLET | ORAL | Status: DC | PRN

## 2023-08-13 MED ORDER — ALBUTEROL SULFATE HFA 108 (90 BASE) MCG/ACT IN AERS: 2.0000 | INHALATION_SPRAY | Freq: Four times a day (QID) | RESPIRATORY_TRACT | Status: DC | PRN

## 2023-08-13 MED ORDER — SODIUM CHLORIDE 0.9% FLUSH: 3.0000 mL | INTRAVENOUS | Status: DC | PRN

## 2023-08-13 MED ORDER — METOPROLOL SUCCINATE ER 25 MG PO TB24: 50.0000 mg | ORAL_TABLET | Freq: Every day | ORAL | Status: DC

## 2023-08-13 MED ORDER — ZOLPIDEM TARTRATE 5 MG PO TABS: 5.0000 mg | ORAL_TABLET | Freq: Every evening | ORAL | Status: DC | PRN

## 2023-08-13 MED ORDER — HEPARIN BOLUS VIA INFUSION
4000.0000 [IU] | Freq: Once | INTRAVENOUS | Status: AC
  Administered 2023-08-13: 4000 [IU] via INTRAVENOUS
  Filled 2023-08-13: qty 4000

## 2023-08-13 MED ORDER — ASPIRIN 81 MG PO CHEW
162.0000 mg | CHEWABLE_TABLET | Freq: Once | ORAL | Status: AC
Start: 2023-08-13 — End: 2023-08-13
  Administered 2023-08-13: 162 mg via ORAL
  Filled 2023-08-13: qty 2

## 2023-08-13 MED ORDER — SODIUM CHLORIDE 0.9% FLUSH
3.0000 mL | Freq: Two times a day (BID) | INTRAVENOUS | Status: DC
  Administered 2023-08-13 – 2023-08-15 (×4): 3 mL via INTRAVENOUS

## 2023-08-13 MED ORDER — ONDANSETRON HCL 4 MG/2ML IJ SOLN: 4.0000 mg | Freq: Four times a day (QID) | INTRAMUSCULAR | Status: DC | PRN

## 2023-08-13 MED ORDER — ROSUVASTATIN CALCIUM 5 MG PO TABS
10.0000 mg | ORAL_TABLET | Freq: Every day | ORAL | Status: DC
  Administered 2023-08-13 – 2023-08-14 (×2): 10 mg via ORAL
  Filled 2023-08-13 (×2): qty 2

## 2023-08-13 MED ORDER — NITROGLYCERIN 2 % TD OINT
0.5000 [in_us] | TOPICAL_OINTMENT | Freq: Four times a day (QID) | TRANSDERMAL | Status: DC
  Administered 2023-08-13 – 2023-08-14 (×2): 0.5 [in_us] via TOPICAL
  Filled 2023-08-13: qty 1
  Filled 2023-08-13: qty 30
  Filled 2023-08-13: qty 1

## 2023-08-13 MED ORDER — COENZYME Q10 100 MG PO CAPS: 100.0000 mg | ORAL_CAPSULE | Freq: Two times a day (BID) | ORAL | Status: DC

## 2023-08-13 NOTE — ED Triage Notes (Signed)
Patient arrived by [facility] for weakness and known left main disease/ scheduled for CABG this Friday. 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

## 2023-08-13 NOTE — ED Provider Notes (Addendum)
[place] EMERGENCY DEPARTMENT AT [facility] Provider Note   CSN: [id number] Arrival date & time: 08/13/23  1057     History  No chief complaint on file.   Rickey Lucas is a 66 y.o. male with past medical history significant for CAD, hypertension, hyperlipidemia who presents with concern for some chest discomfort, weakness while seeing patient today, patient has known left anterior descending artery disease, and is scheduled for CABG this Friday with cardiologist.  Patient took his normal 2 aspirin prior to arrival, received nitroglycerin with EMS x 3.  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  At this time patient is chest pain-free, nausea free.  HPI     Home Medications Prior to Admission medications   Medication Sig Start Date End Date Taking? Authorizing Provider  acetaminophen (TYLENOL) 500 MG tablet Take 500 mg by mouth every 6 (six) hours as needed for mild pain (pain score 1-3) or moderate pain (pain score 4-6).   Yes [provider]  albuterol (PROAIR HFA) 108 (90 Base) MCG/ACT inhaler Inhale 2 puffs into the lungs every 6 (six) hours as needed for wheezing or shortness of breath. 12/18/19  Yes [provider]  Ascorbic Acid (VITAMIN C) 1000 MG tablet Take 500 mg by mouth 2 (two) times daily.   Yes [provider]  aspirin EC 81 MG tablet Take 1 tablet (81 mg total) by mouth daily. Swallow whole. 07/29/23  Yes Croitoru, Mihai, MD  Cholecalciferol (VITAMIN D-3) 125 MCG (5000 UT) TABS Take 5,000 Units by mouth See admin instructions. Twice a week- Sunday and Thursday   Yes [provider]  Coenzyme Q10 100 MG capsule Take 100 mg by mouth 2 (two) times daily.   Yes [provider]  metoprolol succinate (TOPROL XL) 50 MG 24 hr tablet Take 1 tablet  (50 mg total) by mouth daily. 08/09/23  Yes Croitoru, Mihai, MD  Multiple Vitamin (MULTIVITAMIN) tablet Take 1 tablet by mouth daily.   Yes [provider]  rosuvastatin (CRESTOR) 10 MG tablet Take 10 mg by mouth daily. 02/07/22  Yes [provider]  TURMERIC PO Take 500 mg by mouth daily at 12 noon.   Yes [provider]  vitamin E 180 MG (400 UNITS) capsule Take 400 Units by mouth 2 (two) times daily.   Yes [provider]      Allergies    Patient has no known allergies.    Review of Systems   Review of Systems  All other systems reviewed and are negative.   Physical Exam Updated Vital Signs BP 139/84   Pulse 67   Temp 98.4 F (36.9 C) (Oral)   Resp 19   SpO2 95%  Physical Exam Vitals and nursing note reviewed.  Constitutional:      General: He is not in acute distress.    Appearance: Normal appearance.  HENT:     Head: Normocephalic and atraumatic.  Eyes:     General:        Right eye: No discharge.        Left eye: No discharge.  Cardiovascular:     Rate and Rhythm: Normal rate and regular rhythm.     Heart sounds: No murmur heard.    No friction rub. No gallop.  Pulmonary:     Effort: Pulmonary effort is  normal.     Breath sounds: Normal breath sounds.  Abdominal:     General: Bowel sounds are normal.     Palpations: Abdomen is soft.  Skin:    General: Skin is warm and dry.     Capillary Refill: Capillary refill takes less than 2 seconds.  Neurological:     Mental Status: He is alert and oriented to person, place, and time.  Psychiatric:        Mood and Affect: Mood normal.        Behavior: Behavior normal.     ED Results / Procedures / Treatments   Labs (all labs ordered are listed, but only abnormal results are displayed) Labs Reviewed  BASIC METABOLIC PANEL - Abnormal; Notable for the following components:      Result Value   Sodium 128 (*)    Chloride 96 (*)    Glucose, Bld 106 (*)    Calcium 8.8 (*)    All  other components within normal limits  CBC  TROPONIN I (HIGH SENSITIVITY)  TROPONIN I (HIGH SENSITIVITY)    EKG None  Radiology DG Chest 2 View  Result Date: 08/13/2023 CLINICAL DATA:  Chest pain beginning yesterday. Coronary artery disease. EXAM: CHEST - 2 VIEW COMPARISON:  04/19/2013 FINDINGS: The heart size and mediastinal contours are within normal limits. Both lungs are clear. The visualized skeletal structures are unremarkable. IMPRESSION: No active cardiopulmonary disease. Electronically Signed   By: Danae Orleans M.D.   On: 08/13/2023 14:00    Procedures Procedures    Medications Ordered in ED Medications  nitroGLYCERIN (NITROSTAT) SL tablet 0.4 mg (has no administration in time range)  nitroGLYCERIN (NITROGLYN) 2 % ointment 0.5 inch (0.5 inches Topical Given 08/13/23 1447)  aspirin chewable tablet 162 mg (162 mg Oral Given 08/13/23 1138)    ED Course/ Medical Decision Making/ A&P                                 Medical Decision Making Amount and/or Complexity of Data Reviewed Labs: ordered. Radiology: ordered.  Risk OTC drugs. Prescription drug management.   This patient is a 67 y.o. male who presents to the ED for concern of chest pain, this involves an extensive number of treatment options, and is a complaint that carries with it a high risk of complications and morbidity. The emergent differential diagnosis prior to evaluation includes, but is not limited to,  ACS, AAS, PE, Mallory-Weiss, Boerhaave's, Pneumonia, acute bronchitis, asthma or COPD exacerbation, anxiety, MSK pain or traumatic injury to the chest, acid reflux versus other . This is not an exhaustive differential.   Past Medical History / Co-morbidities / Social History: CAD, hypertension, hyperlipidemia  Additional history: Chart reviewed. Pertinent results include: reviewed   Physical Exam: Physical exam performed. The pertinent findings include: Patient overall well-appearing with stable vital  signs in the ED, no tachycardia, afebrile, no arrhythmia  Lab Tests: I ordered, and personally interpreted labs.  The pertinent results include: CBC unremarkable, BMP is notable for hyponatremia, sodium 128, initial troponin is 5   Imaging Studies: I ordered imaging studies including plain film chest x-ray. I independently visualized and interpreted imaging which showed no evidence of acute intrathoracic abnormality. I agree with the radiologist interpretation.   Cardiac Monitoring:  The patient was maintained on a cardiac monitor.  My attending physician Dr. Jeraldine Loots viewed and interpreted the cardiac monitored which showed an underlying rhythm of: Normal  sinus rhythm, no acute ST-T changes. I agree with this interpretation.   Medications: I ordered medication including 162 mg of aspirin as he took 162 this morning to reach a full dose of 324 for suspected unstable angina, patient given nitroglycerin as needed for chest pain, he has not required any in the ED so far   Consultations Obtained: I requested consultation with the cardiologist, spoke with [facility],  and discussed lab and imaging findings as well as pertinent plan - they recommend: Cardiology to eval patient, suspect likely direct admission for monitoring, angina treatment until his CABG on Friday   Disposition: After consideration of the diagnostic results and the patients response to treatment, I feel that patient would benefit from admission for unstable angina in setting of known LAD disease.   3:02 PM Care of Rickey Lucas transferred to [facility] and Dr. Earlene Plater at the end of my shift as the patient will require reassessment once labs/imaging have resulted. Patient presentation, ED course, and plan of care discussed with review of all pertinent labs and imaging. Please see his/her note for further details regarding further ED course and disposition. Plan at time of handoff is patient will be admitted either to medicine or  cardiology, if cardiology does not directly admit, will plan for medicine admission for unstable angina.. This may be altered or completely changed at the discretion of the oncoming team pending results of further workup.  Final Clinical Impression(s) / ED Diagnoses Final diagnoses:  None    Rx / DC Orders ED Discharge Orders     None         Olene Floss, PA-C 08/13/23 1448    [place] 08/13/23 1502    Gerhard Munch, MD 08/13/23 1526

## 2023-08-13 NOTE — ED Provider Notes (Signed)
  Accepted handoff at shift change from [facility]. Please see prior provider note for more detail.   Briefly: Patient is 67 y.o. "concern for some chest discomfort, weakness while seeing patient today, patient has known left anterior descending artery disease, and is scheduled for CABG this Friday with cardiologist. Patient took his normal 2 aspirin prior to arrival, received nitroglycerin with EMS x 3. 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 At this time patient is chest pain-free, nausea free. "   Plan:  -[place] admitting provider          Dorthy Cooler, [place] 08/13/23 2349    Gerhard Munch, MD 08/14/23 [phone]

## 2023-08-13 NOTE — ED Notes (Signed)
Patient transported to CT 

## 2023-08-13 NOTE — H&P (Signed)
Cardiology History and Physical   Patient ID: Rickey Lucas MRN: [id number]; DOB: 20-Sep-1955  Admit date: 08/13/2023 Date of Consult: 08/13/2023  PCP:  Tally Joe, MD    [facility] Providers Cardiologist:  Thurmon Fair, MD        Patient Profile:   Rickey Lucas is a 67 y.o. male with a hx of  syncope 2019 felt vasovagal, CAD w/ CABG planned 12/13, HLD, FH CAD and cardiac arrest in his father at age 34, who is being seen 08/13/2023 for the evaluation of Unstable angina at the request of Dr Jeraldine Loots.  History of Present Illness:   Rickey Lucas has been followed by Dr Royann Shivers. He had an abnl cardiac CT 07/2023 and was referred for cath.  The cath 12/06 showed severe L main and LAD dz >> plan CABG. He saw Dr Laneta Simmers on 12/09. He was reporting consistent CP w/ exertion and was starting to notice some resting pain. CABG was planned for 12/13.  Rickey Lucas said that after he had the cath, he started noticing the chest pain more and realized how frequent it is.  He was having consistent chest pain with minor exertion and started having some chest pain at rest.  He had an episode that was worse than usual today and associated with significant weakness.  He called EMS and came to the hospital.  He has had a total of 4 baby aspirin.  Currently he is pain-free.   Past Medical History:  Diagnosis Date   Asthma    CAD (coronary artery disease), native coronary artery 08/09/2023   abnl cardiac CT >> Cath >>  CABG 08/2023   Family history of premature CAD    HLD (hyperlipidemia)    intraductal papillary Pancreatic cyst     Past Surgical History:  Procedure Laterality Date   COLONOSCOPY  2000   Buccini-normal   LEFT HEART CATH AND CORONARY ANGIOGRAPHY N/A 08/09/2023   Procedure: LEFT HEART CATH AND CORONARY ANGIOGRAPHY;  Surgeon: [name], MD;  Location: [facility] INVASIVE CV LAB;  Service: Cardiovascular;  Laterality: N/A;   TONSILLECTOMY  1959     Home  Medications:  Prior to Admission medications   Medication Sig Start Date End Date Taking? Authorizing Provider  albuterol (PROAIR HFA) 108 (90 Base) MCG/ACT inhaler Inhale 2 puffs into the lungs every 6 (six) hours as needed for wheezing or shortness of breath. 12/18/19   [provider]  Ascorbic Acid (VITAMIN C) 1000 MG tablet Take 500 mg by mouth 2 (two) times daily.    [provider]  aspirin EC 81 MG tablet Take 1 tablet (81 mg total) by mouth daily. Swallow whole. 07/29/23   Croitoru, Mihai, MD  Cholecalciferol (VITAMIN D-3) 125 MCG (5000 UT) TABS Take 5,000 Units by mouth every other day.    [provider]  Coenzyme Q10 100 MG capsule Take 100 mg by mouth daily.    [provider]  loratadine (CLARITIN) 10 MG tablet Take 10 mg by mouth daily as needed (allergies).    [provider]  metoprolol succinate (TOPROL XL) 50 MG 24 hr tablet Take 1 tablet (50 mg total) by mouth daily. 08/09/23   Croitoru, Mihai, MD  Multiple Vitamin (MULTIVITAMIN) tablet Take 1 tablet by mouth daily.    [provider]  rosuvastatin (CRESTOR) 10 MG tablet Take 10 mg by mouth daily. 02/07/22   [provider]  sildenafil (REVATIO) 20 MG tablet Take 20 mg by mouth daily as  needed.    [provider]  TURMERIC PO Take 500 mg by mouth daily at 12 noon.    [provider]  vitamin E 180 MG (400 UNITS) capsule Take 400 Units by mouth 2 (two) times daily.    [provider]    Inpatient Medications: Scheduled Meds:  heparin  4,000 Units Intravenous Once   nitroGLYCERIN  0.5 inch Topical Q6H   Continuous Infusions:  heparin     PRN Meds: nitroGLYCERIN  Allergies:   No Known Allergies  Social History:   Social History   Socioeconomic History   Marital status: Married    Spouse name: Not on file   Number of children: Not on file   Years of education: Not on file   Highest education level: Not on file  Occupational  History   Not on file  Tobacco Use   Smoking status: Never   Smokeless tobacco: Not on file  Vaping Use   Vaping status: Never Used  Substance and Sexual Activity   Alcohol use: No   Drug use: Not Currently   Sexual activity: Not on file  Other Topics Concern   Not on file  Social History Narrative   Not on file   Social Determinants of Health   Financial Resource Strain: Not on file  Food Insecurity: Not on file  Transportation Needs: Not on file  Physical Activity: Not on file  Stress: Not on file  Social Connections: Not on file  Intimate Partner Violence: Not on file    Family History:   Family History  Problem Relation Age of Onset   Ovarian cancer Mother    Diabetes Brother    Breast cancer Maternal Grandmother    Diabetes Paternal Grandmother    Stroke Paternal Grandmother    Esophageal cancer Paternal Grandfather    Colon cancer Neg Hx    Colon polyps Neg Hx    Stomach cancer Neg Hx      ROS:  Please see the history of present illness.  All other ROS reviewed and negative.     Physical Exam/Data:   Vitals:   08/13/23 1104 08/13/23 1145 08/13/23 1400 08/13/23 1505  BP: 120/80 105/75 139/84   Pulse: 78 (!) 59 67   Resp: 18 18 19    Temp: 98.4 F (36.9 C)   98.2 F (36.8 C)  TempSrc: Oral   Oral  SpO2: 97% 95% 95%    No intake or output data in the 24 hours ending 08/13/23 1522    08/12/2023   10:14 AM 08/09/2023    8:46 AM 07/08/2023    3:34 PM  Last 3 Weights  Weight (lbs) 183 lb 180 lb 185 lb 3.2 oz  Weight (kg) 83.008 kg 81.647 kg 84.006 kg     There is no height or weight on file to calculate BMI.  General:  Well nourished, well developed, in no acute distress HEENT: normal Neck: no JVD Vascular: No carotid bruits; Distal pulses 2+ bilaterally Cardiac:  normal S1, S2; RRR; no murmur  Lungs:  clear to auscultation bilaterally, no wheezing, rhonchi or rales  Abd: soft, nontender, no hepatomegaly  Ext: no edema Musculoskeletal:  No  deformities, BUE and BLE strength normal and equal Skin: warm and dry  Neuro:  CNs 2-12 intact, no focal abnormalities noted Psych:  Normal affect   EKG:  The EKG was personally reviewed and demonstrates:  SR, HR 72, mild diffuse ST depression Telemetry:  Telemetry was personally reviewed and  demonstrates:  SR   Relevant CV Studies:  CARDIAC CATH: 08/09/2023   Mid LM to Dist LM lesion is 75% stenosed.   Prox LAD to Mid LAD lesion is 70% stenosed.   Prox RCA lesion is 30% stenosed.   RPAV lesion is 50% stenosed.   Severe non-calcified distal left main stenosis of 75% Severe calcified proximal LAD stenosis of 70-75% Patent LCx with large, graftable OM Large, dominant RCA with mild nonobstructive plaquing Normal LVEDP   Recommendations: Outpatient cardiac surgical evaluation for CABG. Advised to avoid strenuous activity. Otherwise, continue current medical therapy.  Diagnostic Dominance: Right  ECHO: 08/09/2023 1. Left ventricular ejection fraction, by estimation, is 60 to 65%. The left ventricle has normal function. The left ventricle has no regional wall motion abnormalities. Left ventricular diastolic parameters are consistent with Grade I diastolic dysfunction (impaired relaxation). The average left ventricular global  longitudinal strain is -18.0 %. The global longitudinal strain is normal.   2. Right ventricular systolic function is normal. The right ventricular size is normal. Tricuspid regurgitation signal is inadequate for assessing PA pressure.   3. The mitral valve is grossly normal. Trivial mitral valve  regurgitation.   4. The aortic valve is tricuspid. Aortic valve regurgitation is not  visualized.   5. The inferior vena cava is normal in size with greater than 50% respiratory variability, suggesting right atrial pressure of 3 mmHg.    Laboratory Data:  High Sensitivity Troponin:   Recent Labs  Lab 08/13/23 1108 08/13/23 1323  TROPONINIHS 5 6      Chemistry Recent Labs  Lab 08/13/23 1108  NA 128*  K 4.2  CL 96*  CO2 22  GLUCOSE 106*  BUN 12  CREATININE 0.95  CALCIUM 8.8*  GFRNONAA >60  ANIONGAP 10    No results found for: "ALT", "AST", "GGT", "ALKPHOS", "BILITOT"   Lipids - by PCP  Total Cholesterol    HDL   LDL                  Hematology Recent Labs  Lab 08/13/23 1108  WBC 7.6  RBC 4.85  HGB 15.6  HCT 46.0  MCV 94.8  MCH 32.2  MCHC 33.9  RDW 13.1  PLT 286   Thyroid No results for input(s): "TSH", "FREET4" in the last 168 hours.  BNPNo results for input(s): "BNP", "PROBNP" in the last 168 hours.  DDimer No results for input(s): "DDIMER" in the last 168 hours.   Radiology/Studies:  DG Chest 2 View  Result Date: 08/13/2023 CLINICAL DATA:  Chest pain beginning yesterday. Coronary artery disease. EXAM: CHEST - 2 VIEW COMPARISON:  04/19/2013 FINDINGS: The heart size and mediastinal contours are within normal limits. Both lungs are clear. The visualized skeletal structures are unremarkable. IMPRESSION: No active cardiopulmonary disease. Electronically Signed   By: Danae Orleans M.D.   On: 08/13/2023 14:00   ECHOCARDIOGRAM COMPLETE  Result Date: 08/09/2023    ECHOCARDIOGRAM REPORT   Patient Name:   Rickey Lucas Date of Exam: 08/09/2023 Medical Rec #:  [id number]           Height:       71.0 in Accession #:    [id number]          Weight:       180.0 lb Date of Birth:  1956/06/13            BSA:          2.016 m Patient Age:  67 years            BP:           132/72 mmHg Patient Gender: M                   HR:           60 bpm. Exam Location:  Outpatient Procedure: 2D Echo, Color Doppler and Cardiac Doppler Indications:    CAD of native vessel  History:        Patient has no prior history of Echocardiogram examinations.                 Risk Factors:Family History of Coronary Artery Disease and                 Dyslipidemia.  Sonographer:    Delcie Roch RDCS Referring Phys: [phone] MICHAEL COOPER  IMPRESSIONS  1. Left ventricular ejection fraction, by estimation, is 60 to 65%. The left ventricle has normal function. The left ventricle has no regional wall motion abnormalities. Left ventricular diastolic parameters are consistent with Grade I diastolic dysfunction (impaired relaxation). The average left ventricular global longitudinal strain is -18.0 %. The global longitudinal strain is normal.  2. Right ventricular systolic function is normal. The right ventricular size is normal. Tricuspid regurgitation signal is inadequate for assessing PA pressure.  3. The mitral valve is grossly normal. Trivial mitral valve regurgitation.  4. The aortic valve is tricuspid. Aortic valve regurgitation is not visualized.  5. The inferior vena cava is normal in size with greater than 50% respiratory variability, suggesting right atrial pressure of 3 mmHg. FINDINGS  Left Ventricle: Left ventricular ejection fraction, by estimation, is 60 to 65%. The left ventricle has normal function. The left ventricle has no regional wall motion abnormalities. The average left ventricular global longitudinal strain is -18.0 %. The global longitudinal strain is normal. The left ventricular internal cavity size was normal in size. There is no left ventricular hypertrophy. Left ventricular diastolic parameters are consistent with Grade I diastolic dysfunction (impaired relaxation). Right Ventricle: The right ventricular size is normal. Right ventricular systolic function is normal. Tricuspid regurgitation signal is inadequate for assessing PA pressure. Left Atrium: Left atrial size was normal in size. Right Atrium: Right atrial size was normal in size. Pericardium: There is no evidence of pericardial effusion. Mitral Valve: The mitral valve is grossly normal. Trivial mitral valve regurgitation. Tricuspid Valve: Tricuspid valve regurgitation is not demonstrated. Aortic Valve: The aortic valve is tricuspid. Aortic valve regurgitation is not  visualized. Pulmonic Valve: Pulmonic valve regurgitation is not visualized. Aorta: The aortic root and ascending aorta are structurally normal, with no evidence of dilitation. Venous: The inferior vena cava is normal in size with greater than 50% respiratory variability, suggesting right atrial pressure of 3 mmHg. IAS/Shunts: No atrial level shunt detected by color flow Doppler.  LEFT VENTRICLE PLAX 2D LVIDd:         5.10 cm   Diastology LVIDs:         3.40 cm   LV e' medial:    8.16 cm/s LV PW:         1.00 cm   LV E/e' medial:  6.9 LV IVS:        0.70 cm   LV e' lateral:   10.80 cm/s LVOT diam:     1.90 cm   LV E/e' lateral: 5.2 LV SV:         52 LV SV Index:  26        2D Longitudinal Strain LVOT Area:     2.84 cm  2D Strain GLS Avg:     -18.0 %  RIGHT VENTRICLE             IVC RV Basal diam:  2.60 cm     IVC diam: 1.50 cm RV S prime:     11.20 cm/s TAPSE (M-mode): 1.9 cm LEFT ATRIUM             Index        RIGHT ATRIUM           Index LA diam:        3.20 cm 1.59 cm/m   RA Area:     13.90 cm LA Vol (A2C):   34.6 ml 17.16 ml/m  RA Volume:   33.30 ml  16.52 ml/m LA Vol (A4C):   40.3 ml 19.99 ml/m LA Biplane Vol: 40.9 ml 20.29 ml/m  AORTIC VALVE LVOT Vmax:   90.30 cm/s LVOT Vmean:  55.600 cm/s LVOT VTI:    0.183 m  AORTA Ao Root diam: 3.20 cm Ao Asc diam:  2.90 cm MITRAL VALVE MV Area (PHT): 2.77 cm    SHUNTS MV Decel Time: 274 msec    Systemic VTI:  0.18 m MV E velocity: 56.30 cm/s  Systemic Diam: 1.90 cm MV A velocity: 78.80 cm/s MV E/A ratio:  0.71 Photographer signed by Carolan Clines Signature Date/Time: 08/09/2023/5:16:04 PM    Final      Assessment and Plan:   Unstable anginal pain -Initial troponin was normal.  Repeat pending. - with resting chest pain, Add heparin and nitroglycerin paste. -Make the surgeons aware that he is here, but he understands we are unlikely to be able to move his surgery up. - continue home Rx with ASA, metop XL 50 mg and statin  2. Hyperlipidemia -  followed by PCP - pt says LDL went down significantly on Crestor 10 mg, so PCP did not increase it.  - will ck lipids/liver in am  3. Intraductal papillary pancreatic cyst, abnl LFTs - pt says LFTs are chronically mildly abnl - cyst is followed by Dr Cherlyn Roberts in [place], has been stable  4. Hyponatremia - pt says does not have hx of this, but was drinking a lot of water after the cath - follow   Risk Assessment/Risk Scores:     TIMI Risk Score for Unstable Angina or Non-ST Elevation MI:   The patient's TIMI risk score is 5, which indicates a 26% risk of all cause mortality, new or recurrent myocardial infarction or need for urgent revascularization in the next 14 days.   For questions or updates, please contact [facility] Please consult [email or web address] for contact info under    Signed, Theodore Demark, PA-C  08/13/2023 3:22 PM

## 2023-08-13 NOTE — Progress Notes (Signed)
ANTICOAGULATION CONSULT NOTE - Follow-up Note  Pharmacy Consult for Heparin Indication: chest pain/ACS  No Known Allergies  Patient Measurements:   Heparin Dosing Weight: 83 kg  Vital Signs: Temp: 98.1 F (36.7 C) (12/10 1935) Temp Source: Oral (12/10 1935) BP: 101/68 (12/10 2030) Pulse Rate: 68 (12/10 2030)  Labs: Recent Labs    08/13/23 1108 08/13/23 1323 08/13/23 1445 08/13/23 2102  HGB 15.6  --   --   --   HCT 46.0  --   --   --   PLT 286  --   --   --   HEPARINUNFRC  --   --  <0.10* 0.25*  CREATININE 0.95  --   --   --   TROPONINIHS 5 6  --   --     Estimated Creatinine Clearance: 79.3 mL/min (by C-G formula based on SCr of 0.95 mg/dL).   Medical History: Past Medical History:  Diagnosis Date   Asthma    CAD (coronary artery disease), native coronary artery 08/09/2023   abnl cardiac CT >> Cath >>  CABG 08/2023   Family history of premature CAD    HLD (hyperlipidemia)    intraductal papillary Pancreatic cyst     Medications:  (Not in a hospital admission)  Scheduled:   vitamin C  500 mg Oral BID   aspirin EC  81 mg Oral Daily   [START ON 08/14/2023] metoprolol succinate  50 mg Oral Daily   multivitamin with minerals  1 tablet Oral Daily   nitroGLYCERIN  0.5 inch Topical Q6H   rosuvastatin  10 mg Oral Daily   sodium chloride flush  3 mL Intravenous Q12H   vitamin E  400 Units Oral BID   Infusions:   sodium chloride     heparin 1,100 Units/hr (08/13/23 1553)   PRN: sodium chloride, acetaminophen, albuterol, ALPRAZolam, nitroGLYCERIN, ondansetron (ZOFRAN) IV, sodium chloride flush, zolpidem  Assessment: 67 yom presenting with chest pain and weakness. Heparin per pharmacy consult placed for chest pain/ACS.  Patient was not on anticoagulation prior to arrival. Started on 1100 units/hr IV heparin following 4000 unit IV heparin bolus. Resulting heparin level is 0.25 which is sub-therapeutic. Level drawn slightly early.  No issues with infusion or  bleeding per RN.  Hgb 15.6; plt 286  Goal of Therapy:  Heparin level 0.3-0.7 units/ml Monitor platelets by anticoagulation protocol: Yes   Plan:  Give IV heparin 1000 units bolus x 1 Increase heparin infusion to 1250 units/hr Check anti-Xa level at 0500 and daily while on heparin Continue to monitor H&H and platelets  Delmar Landau, PharmD, BCPS 08/13/2023 10:20 PM ED Clinical Pharmacist -  [phone]

## 2023-08-13 NOTE — Progress Notes (Signed)
PHARMACY - ANTICOAGULATION CONSULT NOTE  Pharmacy Consult for heparin Indication: chest pain/ACS  No Known Allergies  Patient Measurements:   Heparin Dosing Weight: TBW  Vital Signs: Temp: 98.4 F (36.9 C) (12/10 1104) Temp Source: Oral (12/10 1104) BP: 139/84 (12/10 1400) Pulse Rate: 67 (12/10 1400)  Labs: Recent Labs    08/13/23 1108 08/13/23 1323  HGB 15.6  --   HCT 46.0  --   PLT 286  --   CREATININE 0.95  --   TROPONINIHS 5 6    Estimated Creatinine Clearance: 79.3 mL/min (by C-G formula based on SCr of 0.95 mg/dL).   Medical History: Past Medical History:  Diagnosis Date   Asthma    CAD (coronary artery disease), native coronary artery 08/09/2023   abnl cardiac CT >> Cath >>  CABG 08/2023   Family history of premature CAD    HLD (hyperlipidemia)      Assessment: 56 YOM presenting with CP and weakness, hx CAD, he is not on anticoagulation PTA, CBC wnl  Goal of Therapy:  Heparin level 0.3-0.7 units/ml Monitor platelets by anticoagulation protocol: Yes   Plan:  Heparin 4000 units Iv x 1, and gtt at 1100 units/hr F/u 6 hour heparin level F/u cards eval and recs  Daylene Posey, PharmD, [facility] Clinical Pharmacist ED Pharmacist Phone # [phone] 08/13/2023 3:07 PM

## 2023-08-14 ENCOUNTER — Ambulatory Visit (HOSPITAL_COMMUNITY): Payer: Medicare Other

## 2023-08-14 ENCOUNTER — Other Ambulatory Visit (HOSPITAL_COMMUNITY): Payer: Medicare Other

## 2023-08-14 DIAGNOSIS — I2510 Atherosclerotic heart disease of native coronary artery without angina pectoris: Secondary | ICD-10-CM | POA: Diagnosis not present

## 2023-08-14 DIAGNOSIS — I200 Unstable angina: Secondary | ICD-10-CM | POA: Diagnosis not present

## 2023-08-14 LAB — BASIC METABOLIC PANEL
Anion gap: 5 (ref 5–15)
BUN: 12 mg/dL (ref 8–23)
CO2: 23 mmol/L (ref 22–32)
Calcium: 8.9 mg/dL (ref 8.9–10.3)
Chloride: 104 mmol/L (ref 98–111)
Creatinine, Ser: 0.82 mg/dL (ref 0.61–1.24)
GFR, Estimated: >60 mL/min (ref >60)
Glucose, Bld: 110 mg/dL — ABNORMAL HIGH (ref 70–99)
Potassium: 3.9 mmol/L (ref 3.5–5.1)
Sodium: 132 mmol/L — ABNORMAL LOW (ref 135–145)

## 2023-08-14 LAB — HEPATIC FUNCTION PANEL
ALT: 42 U/L (ref 0–44)
AST: 38 U/L (ref 15–41)
Albumin: 3.6 g/dL (ref 3.5–5.0)
Alkaline Phosphatase: 66 U/L (ref 38–126)
Bilirubin, Direct: 0.2 mg/dL (ref 0.0–0.2)
Indirect Bilirubin: 0.8 mg/dL (ref 0.3–0.9)
Total Bilirubin: 1 mg/dL (ref <1.2)
Total Protein: 6.2 g/dL — ABNORMAL LOW (ref 6.5–8.1)

## 2023-08-14 LAB — LIPID PANEL
Cholesterol: 117 mg/dL (ref 0–200)
HDL: 49 mg/dL (ref >40)
LDL Cholesterol: 58 mg/dL (ref 0–99)
Total CHOL/HDL Ratio: 2.4 {ratio}
Triglycerides: 52 mg/dL (ref <150)
VLDL: 10 mg/dL (ref 0–40)

## 2023-08-14 LAB — CBC
HCT: 45.4 % (ref 39.0–52.0)
Hemoglobin: 15.7 g/dL (ref 13.0–17.0)
MCH: 32.3 pg (ref 26.0–34.0)
MCHC: 34.6 g/dL (ref 30.0–36.0)
MCV: 93.4 fL (ref 80.0–100.0)
Platelets: 270 10*3/uL (ref 150–400)
RBC: 4.86 MIL/uL (ref 4.22–5.81)
RDW: 13.4 % (ref 11.5–15.5)
WBC: 11.3 10*3/uL — ABNORMAL HIGH (ref 4.0–10.5)
nRBC: 0 % (ref 0.0–0.2)

## 2023-08-14 LAB — HEPARIN LEVEL (UNFRACTIONATED): Heparin Unfractionated: 0.45 [IU]/mL (ref 0.30–0.70)

## 2023-08-14 LAB — MAGNESIUM: Magnesium: 2.1 mg/dL (ref 1.7–2.4)

## 2023-08-14 LAB — MRSA NEXT GEN BY PCR, NASAL: MRSA by PCR Next Gen: NOT DETECTED

## 2023-08-14 MED ORDER — ROSUVASTATIN CALCIUM 20 MG PO TABS
20.0000 mg | ORAL_TABLET | Freq: Every day | ORAL | Status: DC
  Administered 2023-08-15 – 2023-08-21 (×6): 20 mg via ORAL
  Filled 2023-08-14 (×6): qty 1

## 2023-08-14 MED ORDER — NITROGLYCERIN 2 % TD OINT
1.0000 [in_us] | TOPICAL_OINTMENT | Freq: Four times a day (QID) | TRANSDERMAL | Status: DC
  Administered 2023-08-14 – 2023-08-16 (×8): 1 [in_us] via TOPICAL
  Filled 2023-08-14: qty 30

## 2023-08-14 NOTE — Progress Notes (Signed)
TCTS   Subjective:  Admitted yesterday from ER. I saw him in the office Monday with plans for CABG Friday am. 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 Went home in the evening and felt ok but then went to work yesterday and developed SOB walking around in the morning which worsened walking into work. Developed some chest discomfort, nausea and diaphoresis and called 911. ECG with non-specific changes and troponins low at 5, 6. No further problems since admission on Heparin and topical NTG.  Objective: Vital signs in last 24 hours: Temp:  [97.9 F (36.6 C)-98.8 F (37.1 C)] 98.5 F (36.9 C) (12/11 0644) Pulse Rate:  [59-78] 65 (12/11 0644) Cardiac Rhythm: Normal sinus rhythm (12/10 1720) Resp:  [11-22] 16 (12/11 0644) BP: (94-139)/(66-84) 103/66 (12/11 0644) SpO2:  [91 %-97 %] 94 % (12/11 0644) Weight:  [80.3 kg] 80.3 kg (12/11 0644)  Hemodynamic parameters for last 24 hours:    Intake/Output from previous day: 12/10 0701 - 12/11 0700 In: -  Out: 525 [Urine:525] Intake/Output this shift: No intake/output data recorded.  General appearance: alert and cooperative Heart: regular rate and rhythm Lungs: clear to auscultation bilaterally  Lab Results: Recent Labs    08/13/23 1108 08/14/23 0539  WBC 7.6 11.3*  HGB 15.6 15.7  HCT 46.0 45.4  PLT 286 270   BMET:  Recent Labs    08/13/23 1108 08/14/23 0539  NA 128* 132*  K 4.2 3.9  CL 96* 104  CO2 22 23  GLUCOSE 106* 110*  BUN 12 12  CREATININE 0.95 0.82  CALCIUM 8.8* 8.9    PT/INR: No results for input(s): "LABPROT", "INR" in the last 72 hours. ABG No results found for: "PHART", "HCO3", "TCO2", "ACIDBASEDEF", "O2SAT" CBG (last 3)  No results for input(s): "GLUCAP" in the last 72 hours.  Assessment/Plan:  High grade LM and LAD disease. Plan CABG Friday am. He is in agreement to proceed.   LOS: 1 day    Alleen Borne 08/14/2023

## 2023-08-14 NOTE — ED Notes (Signed)
ED TO INPATIENT HANDOFF REPORT  ED Nurse Name and Phone #:   S Name/Age/Gender [name] 67 y.o. male Room/Bed: 044C/044C  Code Status   Code Status: Full Code  Home/SNF/Other Home Patient oriented to: self, place, time, and situation Is this baseline? Yes   Triage Complete: Triage complete  Chief Complaint Unstable angina [facility]) [I20.0]  Triage Note Patient arrived by [facility] for weakness and known left main disease/ scheduled for CABG this Friday. 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   Allergies No Known Allergies  Level of Care/Admitting Diagnosis ED Disposition     ED Disposition  Admit   Condition  --   Comment  Hospital [facility] [[id number]]  Level of Care: Telemetry Cardiac [[id number]]  May admit patient to [facility] or [facility] if equivalent level of care is available:: No  Covid Evaluation: Asymptomatic - no recent exposure (last 10 days) testing not required  Diagnosis: Unstable angina [facility]) [[id number]]  Admitting Physician: [place], PETER M [[id number]]  Attending Physician: [place], PETER M [id number]  Certification:: I certify this patient will need inpatient services for at least 2 midnights  Expected Medical Readiness: 08/21/2023          B Medical/Surgery History Past Medical History:  Diagnosis Date   Asthma    CAD (coronary artery disease), native coronary artery 08/09/2023   abnl cardiac CT >> Cath >>  CABG 08/2023   Family history of premature CAD    HLD (hyperlipidemia)    intraductal papillary Pancreatic cyst    Past Surgical History:  Procedure Laterality Date   COLONOSCOPY  2000   Buccini-normal   LEFT HEART CATH AND CORONARY ANGIOGRAPHY N/A 08/09/2023   Procedure: LEFT HEART CATH AND CORONARY ANGIOGRAPHY;  Surgeon: [name], MD;  Location: [facility] INVASIVE CV LAB;  Service: Cardiovascular;  Laterality: N/A;   TONSILLECTOMY  1959     A IV Location/Drains/Wounds Patient  Lines/Drains/Airways Status     Active Line/Drains/Airways     Name Placement date Placement time Site Days   Peripheral IV 08/13/23 18 G Anterior;Left Antecubital 08/13/23  1115  Antecubital  1   Peripheral IV 08/13/23 20 G Anterior;Right Forearm 08/13/23  1549  Forearm  1            Intake/Output Last 24 hours  Intake/Output Summary (Last 24 hours) at 08/14/2023 0544 Last data filed at 08/13/2023 2229 Gross per 24 hour  Intake --  Output 525 ml  Net -525 ml    Labs/Imaging Results for orders placed or performed during the hospital encounter of 08/13/23 (from the past 48 hour(s))  Basic metabolic panel     Status: Abnormal   Collection Time: 08/13/23 11:08 AM  Result Value Ref Range   Sodium 128 (L) 135 - 145 mmol/L   Potassium 4.2 3.5 - 5.1 mmol/L   Chloride 96 (L) 98 - 111 mmol/L   CO2 22 22 - 32 mmol/L   Glucose, Bld 106 (H) 70 - 99 mg/dL    Comment: Glucose reference range applies only to samples taken after fasting for at least 8 hours.   BUN 12 8 - 23 mg/dL   Creatinine, Ser 8.65 0.61 - 1.24 mg/dL   Calcium 8.8 (L) 8.9 - 10.3 mg/dL   GFR, Estimated >78 >46 mL/min    Comment: (NOTE) Calculated using the CKD-EPI Creatinine Equation (2021)    Anion gap 10 5 - 15    Comment: Performed at [facility], [address], [place], [place] [address]  CBC     Status: None   Collection Time: 08/13/23 11:08 AM  Result Value Ref Range   WBC 7.6 4.0 - 10.5 K/uL   RBC 4.85 4.22 - 5.81 MIL/uL   Hemoglobin 15.6 13.0 - 17.0 g/dL   HCT 30.8 65.7 - 84.6 %   MCV 94.8 80.0 - 100.0 fL   MCH 32.2 26.0 - 34.0 pg   MCHC 33.9 30.0 - 36.0 g/dL   RDW 96.2 95.2 - 84.1 %   Platelets 286 150 - 400 K/uL   nRBC 0.0 0.0 - 0.2 %    Comment: Performed at [facility], [address] [place], [place] [address]  Troponin I (High Sensitivity)     Status: None   Collection Time: 08/13/23 11:08 AM  Result Value Ref Range   Troponin I (High Sensitivity) 5 <18 ng/L    Comment:  (NOTE) Elevated high sensitivity troponin I (hsTnI) values and significant  changes across serial measurements may suggest ACS but many other  chronic and acute conditions are known to elevate hsTnI results.  Refer to the "Links" section for chest pain algorithms and additional  guidance. Performed at [facility], [address], [place], [place] [address]   Troponin I (High Sensitivity)     Status: None   Collection Time: 08/13/23  1:23 PM  Result Value Ref Range   Troponin I (High Sensitivity) 6 <18 ng/L    Comment: (NOTE) Elevated high sensitivity troponin I (hsTnI) values and significant  changes across serial measurements may suggest ACS but many other  chronic and acute conditions are known to elevate hsTnI results.  Refer to the "Links" section for chest pain algorithms and additional  guidance. Performed at [facility], [address] [place], [place] [address]   Heparin level (unfractionated)     Status: Abnormal   Collection Time: 08/13/23  2:45 PM  Result Value Ref Range   Heparin Unfractionated <0.10 (L) 0.30 - 0.70 IU/mL    Comment: (NOTE) The clinical reportable range upper limit is being lowered to >1.10 to align with the FDA approved guidance for the current laboratory assay.  If heparin results are below expected values, and patient dosage has  been confirmed, suggest follow up testing of antithrombin III levels. Performed at [facility], [address] [place], [place] [address]   HIV Antibody (routine testing w rflx)     Status: None   Collection Time: 08/13/23  9:02 PM  Result Value Ref Range   HIV Screen 4th Generation wRfx Non Reactive Non Reactive    Comment: Performed at [facility], [address], [place], [place] [address]  Heparin level (unfractionated)     Status: Abnormal   Collection Time: 08/13/23  9:02 PM  Result Value Ref Range   Heparin Unfractionated 0.25 (L) 0.30 - 0.70 IU/mL    Comment: (NOTE) The  clinical reportable range upper limit is being lowered to >1.10 to align with the FDA approved guidance for the current laboratory assay.  If heparin results are below expected values, and patient dosage has  been confirmed, suggest follow up testing of antithrombin III levels. Performed at [facility], [address] [place], [place] [address]    DG Chest 2 View  Result Date: 08/13/2023 CLINICAL DATA:  Chest pain beginning yesterday. Coronary artery disease. EXAM: CHEST - 2 VIEW COMPARISON:  04/19/2013 FINDINGS:  The heart size and mediastinal contours are within normal limits. Both lungs are clear. The visualized skeletal structures are unremarkable. IMPRESSION: No active cardiopulmonary disease. Electronically Signed   By: Danae Orleans M.D.   On: 08/13/2023 14:00    Pending Labs Unresulted Labs (From admission, onward)     Start     Ordered   08/15/23 0500  Heparin level (unfractionated)  Daily,   R      08/13/23 2223   08/14/23 0500  CBC  Daily,   R     Placed in "And" Linked Group   08/13/23 1508   08/14/23 0500  Lipid panel  Tomorrow morning,   R        08/13/23 1517   08/14/23 0500  Hepatic function panel  Tomorrow morning,   R        08/13/23 1517   08/14/23 0500  Lipoprotein A (LPA)  Tomorrow morning,   R        08/13/23 1719   08/14/23 0500  Magnesium  Tomorrow morning,   R        08/13/23 1719   08/14/23 0500  Basic metabolic panel  Tomorrow morning,   R        08/13/23 1719   08/14/23 0500  Heparin level (unfractionated)  Once-Timed,   TIMED        08/13/23 2223            Vitals/Pain Today's Vitals   08/14/23 0430 08/14/23 0500 08/14/23 0530 08/14/23 0542  BP: 139/83 123/80 107/71   Pulse: 69 70 65   Resp: 17 16 17    Temp:    98.8 F (37.1 C)  TempSrc:    Oral  SpO2: 96% 94% 92%   PainSc:    0-No pain    Isolation Precautions No active isolations  Medications Medications  heparin ADULT infusion 100 units/mL (25000 units/278mL) (1,250  Units/hr Intravenous Rate/Dose Change 08/13/23 2229)  nitroGLYCERIN (NITROSTAT) SL tablet 0.4 mg (has no administration in time range)  acetaminophen (TYLENOL) tablet 650 mg (has no administration in time range)  ondansetron (ZOFRAN) injection 4 mg (has no administration in time range)  zolpidem (AMBIEN) tablet 5 mg (has no administration in time range)  sodium chloride flush (NS) 0.9 % injection 3 mL (3 mLs Intravenous Given 08/13/23 2128)  sodium chloride flush (NS) 0.9 % injection 3 mL (has no administration in time range)  0.9 %  sodium chloride infusion (has no administration in time range)  ALPRAZolam (XANAX) tablet 0.25 mg (has no administration in time range)  ascorbic acid (VITAMIN C) tablet 500 mg (500 mg Oral Given 08/13/23 2125)  aspirin EC tablet 81 mg (81 mg Oral Not Given 08/13/23 1732)  multivitamin with minerals tablet 1 tablet (1 tablet Oral Given 08/13/23 1904)  rosuvastatin (CRESTOR) tablet 10 mg (10 mg Oral Given 08/13/23 1901)  vitamin E capsule 400 Units (400 Units Oral Not Given 08/13/23 2143)  nitroGLYCERIN (NITROGLYN) 2 % ointment 0.5 inch (0.5 inches Topical Given 08/14/23 0356)  albuterol (PROVENTIL) (2.5 MG/3ML) 0.083% nebulizer solution 3 mL (has no administration in time range)  metoprolol succinate (TOPROL-XL) 24 hr tablet 50 mg (has no administration in time range)  aspirin chewable tablet 162 mg (162 mg Oral Given 08/13/23 1138)  heparin bolus via infusion 4,000 Units (4,000 Units Intravenous Bolus from Bag 08/13/23 1553)  heparin bolus via infusion 1,000 Units (1,000 Units Intravenous Bolus from Bag 08/13/23 2227)    Mobility walks  Focused Assessments    R Recommendations: See Admitting Provider Note  Report given to:   Additional Notes:

## 2023-08-14 NOTE — H&P (View-Only) (Signed)
 TCTS   Subjective:  Admitted yesterday from ER. I saw him in the office Monday with plans for CABG Friday am. 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 Went home in the evening and felt ok but then went to work yesterday and developed SOB walking around in the morning which worsened walking into work. Developed some chest discomfort, nausea and diaphoresis and called 911. ECG with non-specific changes and troponins low at 5, 6. No further problems since admission on Heparin and topical NTG.  Objective: Vital signs in last 24 hours: Temp:  [97.9 F (36.6 C)-98.8 F (37.1 C)] 98.5 F (36.9 C) (12/11 0644) Pulse Rate:  [59-78] 65 (12/11 0644) Cardiac Rhythm: Normal sinus rhythm (12/10 1720) Resp:  [11-22] 16 (12/11 0644) BP: (94-139)/(66-84) 103/66 (12/11 0644) SpO2:  [91 %-97 %] 94 % (12/11 0644) Weight:  [80.3 kg] 80.3 kg (12/11 0644)  Hemodynamic parameters for last 24 hours:    Intake/Output from previous day: 12/10 0701 - 12/11 0700 In: -  Out: 525 [Urine:525] Intake/Output this shift: No intake/output data recorded.  General appearance: alert and cooperative Heart: regular rate and rhythm Lungs: clear to auscultation bilaterally  Lab Results: Recent Labs    08/13/23 1108 08/14/23 0539  WBC 7.6 11.3*  HGB 15.6 15.7  HCT 46.0 45.4  PLT 286 270   BMET:  Recent Labs    08/13/23 1108 08/14/23 0539  NA 128* 132*  K 4.2 3.9  CL 96* 104  CO2 22 23  GLUCOSE 106* 110*  BUN 12 12  CREATININE 0.95 0.82  CALCIUM 8.8* 8.9    PT/INR: No results for input(s): "LABPROT", "INR" in the last 72 hours. ABG No results found for: "PHART", "HCO3", "TCO2", "ACIDBASEDEF", "O2SAT" CBG (last 3)  No results for input(s): "GLUCAP" in the last 72 hours.  Assessment/Plan:  High grade LM and LAD disease. Plan CABG Friday am. He is in agreement to proceed.   LOS: 1 day    Alleen Borne 08/14/2023

## 2023-08-14 NOTE — Progress Notes (Signed)
   Patient Name: Rickey Lucas Date of Encounter: 08/14/2023 [facility] Cardiologist: Thurmon Fair, MD   Interval Summary  .    Mild chest discomfort, but mostly with coughing.   Vital Signs .    Vitals:   08/14/23 0542 08/14/23 0545 08/14/23 0600 08/14/23 0644  BP:   112/74 103/66  Pulse:  71 66 65  Resp:  17 11 16   Temp: 98.8 F (37.1 C)   98.5 F (36.9 C)  TempSrc: Oral   Oral  SpO2:  96% 94% 94%  Weight:    80.3 kg  Height:    5\' 11"  (1.803 m)    Intake/Output Summary (Last 24 hours) at 08/14/2023 0823 Last data filed at 08/13/2023 2229 Gross per 24 hour  Intake --  Output 525 ml  Net -525 ml      08/14/2023    6:44 AM 08/12/2023   10:14 AM 08/09/2023    8:46 AM  Last 3 Weights  Weight (lbs) 177 lb 0.5 oz 183 lb 180 lb  Weight (kg) 80.3 kg 83.008 kg 81.647 kg      Telemetry/ECG    Sinus Rhythm - Personally Reviewed  Physical Exam .   GEN: No acute distress.   Neck: No JVD Cardiac: RRR, no murmurs, rubs, or gallops.  Respiratory: Clear to auscultation bilaterally. GI: Soft, nontender, non-distended  MS: No edema  Assessment & Plan .    67 y.o. male with a hx of  syncope 2019 felt vasovagal, CAD w/ CABG planned 12/13, HLD, FH CAD and cardiac arrest in his father at age 74, who was seen 08/13/2023 for the evaluation of Unstable angina at the request of Dr Jeraldine Loots.    Unstable Angina CAD -- initially underwent outpatient cath with planned outpatient CABG on 12/13. Presented back with chest pain. Will remain inpatient until surgery -- continue IV heparin, ASA, Toprol, Crestor and NTG paste   HLD -- increase crestor to 20mg  daily -- LDL 62   Intraductal papillary pancreatic cyst, abnl LFTs -- pt says LFTs are chronically mildly abnl -- cyst is followed by Dr Cherlyn Canaan Prue in [place], has been stable  Hyponatremia -- reports he had increases his water intake post cath -- improved 128>> 132  For questions or updates, please  contact [facility] Please consult [email or web address] for contact info under        Signed, Laverda Page, NP

## 2023-08-14 NOTE — Progress Notes (Signed)
ANTICOAGULATION CONSULT NOTE - Follow-up Note  Pharmacy Consult for Heparin Indication: chest pain/ACS  No Known Allergies  Patient Measurements: Height: 5\' 11"  (180.3 cm) Weight: 80.3 kg (177 lb 0.5 oz) IBW/kg (Calculated) : 75.3 Heparin Dosing Weight: 83 kg  Vital Signs: Temp: 98.5 F (36.9 C) (12/11 0644) Temp Source: Oral (12/11 0644) BP: 103/66 (12/11 0644) Pulse Rate: 65 (12/11 0644)  Labs: Recent Labs    08/13/23 1108 08/13/23 1323 08/13/23 1445 08/13/23 2102 08/14/23 0539  HGB 15.6  --   --   --  15.7  HCT 46.0  --   --   --  45.4  PLT 286  --   --   --  270  HEPARINUNFRC  --   --  <0.10* 0.25* 0.45  CREATININE 0.95  --   --   --  0.82  TROPONINIHS 5 6  --   --   --     Estimated Creatinine Clearance: 91.8 mL/min (by C-G formula based on SCr of 0.82 mg/dL).   Medical History: Past Medical History:  Diagnosis Date   Asthma    CAD (coronary artery disease), native coronary artery 08/09/2023   abnl cardiac CT >> Cath >>  CABG 08/2023   Family history of premature CAD    HLD (hyperlipidemia)    intraductal papillary Pancreatic cyst     Medications:  Medications Prior to Admission  Medication Sig Dispense Refill Last Dose   acetaminophen (TYLENOL) 500 MG tablet Take 500 mg by mouth every 6 (six) hours as needed for mild pain (pain score 1-3) or moderate pain (pain score 4-6).   Past Month   albuterol (PROAIR HFA) 108 (90 Base) MCG/ACT inhaler Inhale 2 puffs into the lungs every 6 (six) hours as needed for wheezing or shortness of breath.   08/13/2023   Ascorbic Acid (VITAMIN C) 1000 MG tablet Take 500 mg by mouth 2 (two) times daily.   08/13/2023   aspirin EC 81 MG tablet Take 1 tablet (81 mg total) by mouth daily. Swallow whole.   08/13/2023   Cholecalciferol (VITAMIN D-3) 125 MCG (5000 UT) TABS Take 5,000 Units by mouth See admin instructions. Twice a week- Sunday and Thursday   08/12/2023   Coenzyme Q10 100 MG capsule Take 100 mg by mouth 2 (two) times  daily.   08/13/2023   metoprolol succinate (TOPROL XL) 50 MG 24 hr tablet Take 1 tablet (50 mg total) by mouth daily. 90 tablet 3 08/13/2023   Multiple Vitamin (MULTIVITAMIN) tablet Take 1 tablet by mouth daily.   08/13/2023   rosuvastatin (CRESTOR) 10 MG tablet Take 10 mg by mouth daily.   08/12/2023   TURMERIC PO Take 500 mg by mouth daily at 12 noon.   08/13/2023   vitamin E 180 MG (400 UNITS) capsule Take 400 Units by mouth 2 (two) times daily.   08/13/2023   Scheduled:   vitamin C  500 mg Oral BID   aspirin EC  81 mg Oral Daily   metoprolol succinate  50 mg Oral Daily   multivitamin with minerals  1 tablet Oral Daily   nitroGLYCERIN  1 inch Topical Q6H   [START ON 08/15/2023] rosuvastatin  20 mg Oral Daily   sodium chloride flush  3 mL Intravenous Q12H   vitamin E  400 Units Oral BID   Infusions:   sodium chloride     heparin 1,250 Units/hr (08/13/23 2229)   PRN: sodium chloride, acetaminophen, albuterol, ALPRAZolam, nitroGLYCERIN, ondansetron (ZOFRAN) IV, sodium  chloride flush, zolpidem  Assessment: 67 yom presenting with chest pain and weakness. Heparin per pharmacy consult placed for chest pain/ACS. He has LM disease and plans are for CABG on 12/13  -heparin level at goal on 1250 units/hr, CBC stable  Goal of Therapy:  Heparin level 0.3-0.7 units/ml Monitor platelets by anticoagulation protocol: Yes   Plan:  -Continue heparin at 1250 units/hr -Daily heparin level and CBC  Harland German, PharmD Clinical Pharmacist **Pharmacist phone directory can now be found on [email or web address] ([email or web address]).  Listed under [facility].

## 2023-08-14 NOTE — Plan of Care (Signed)

## 2023-08-15 ENCOUNTER — Inpatient Hospital Stay (HOSPITAL_COMMUNITY): Payer: Medicare Other

## 2023-08-15 DIAGNOSIS — Z01810 Encounter for preprocedural cardiovascular examination: Secondary | ICD-10-CM

## 2023-08-15 DIAGNOSIS — I200 Unstable angina: Secondary | ICD-10-CM | POA: Diagnosis not present

## 2023-08-15 LAB — CBC
HCT: 44.6 % (ref 39.0–52.0)
Hemoglobin: 15.1 g/dL (ref 13.0–17.0)
MCH: 31.9 pg (ref 26.0–34.0)
MCHC: 33.9 g/dL (ref 30.0–36.0)
MCV: 94.3 fL (ref 80.0–100.0)
Platelets: 263 10*3/uL (ref 150–400)
RBC: 4.73 MIL/uL (ref 4.22–5.81)
RDW: 13.2 % (ref 11.5–15.5)
WBC: 9.7 10*3/uL (ref 4.0–10.5)
nRBC: 0 % (ref 0.0–0.2)

## 2023-08-15 LAB — URINALYSIS, ROUTINE W REFLEX MICROSCOPIC
Bilirubin Urine: NEGATIVE
Glucose, UA: NEGATIVE mg/dL
Hgb urine dipstick: NEGATIVE
Ketones, ur: NEGATIVE mg/dL
Leukocytes,Ua: NEGATIVE
Nitrite: NEGATIVE
Protein, ur: NEGATIVE mg/dL
Specific Gravity, Urine: 1.023 (ref 1.005–1.030)
pH: 6 (ref 5.0–8.0)

## 2023-08-15 LAB — BLOOD GAS, ARTERIAL
Acid-base deficit: 2.2 mmol/L — ABNORMAL HIGH (ref 0.0–2.0)
Bicarbonate: 22.4 mmol/L (ref 20.0–28.0)
Drawn by: 24610
O2 Saturation: 96.3 %
Patient temperature: 37
pCO2 arterial: 37 mm[Hg] (ref 32–48)
pH, Arterial: 7.39 (ref 7.35–7.45)
pO2, Arterial: 75 mm[Hg] — ABNORMAL LOW (ref 83–108)

## 2023-08-15 LAB — TYPE AND SCREEN
ABO/RH(D): A NEG
Antibody Screen: NEGATIVE

## 2023-08-15 LAB — APTT: aPTT: 63 s — ABNORMAL HIGH (ref 24–36)

## 2023-08-15 LAB — LIPOPROTEIN A (LPA): Lipoprotein (a): 143.1 nmol/L — ABNORMAL HIGH (ref <75.0)

## 2023-08-15 LAB — HEMOGLOBIN A1C
Hgb A1c MFr Bld: 5.9 % — ABNORMAL HIGH (ref 4.8–5.6)
Mean Plasma Glucose: 122.63 mg/dL

## 2023-08-15 LAB — ABO/RH: ABO/RH(D): A NEG

## 2023-08-15 LAB — PROTIME-INR
INR: 1 (ref 0.8–1.2)
Prothrombin Time: 13.7 s (ref 11.4–15.2)

## 2023-08-15 LAB — HEPARIN LEVEL (UNFRACTIONATED): Heparin Unfractionated: 0.44 [IU]/mL (ref 0.30–0.70)

## 2023-08-15 MED ORDER — TRANEXAMIC ACID 1000 MG/10ML IV SOLN
1.5000 mg/kg/h | INTRAVENOUS | Status: AC
  Administered 2023-08-16: 1.5 mg/kg/h via INTRAVENOUS
  Filled 2023-08-15: qty 25

## 2023-08-15 MED ORDER — CHLORHEXIDINE GLUCONATE CLOTH 2 % EX PADS
6.0000 | MEDICATED_PAD | Freq: Once | CUTANEOUS | Status: AC
  Administered 2023-08-15: 6 via TOPICAL

## 2023-08-15 MED ORDER — CHLORHEXIDINE GLUCONATE 0.12 % MT SOLN
15.0000 mL | Freq: Once | OROMUCOSAL | Status: AC
  Administered 2023-08-16: 15 mL via OROMUCOSAL
  Filled 2023-08-15: qty 15

## 2023-08-15 MED ORDER — VANCOMYCIN HCL 1250 MG/250ML IV SOLN
1250.0000 mg | INTRAVENOUS | Status: AC
  Administered 2023-08-16: 1250 mg via INTRAVENOUS
  Filled 2023-08-15: qty 250

## 2023-08-15 MED ORDER — CEFAZOLIN SODIUM-DEXTROSE 2-4 GM/100ML-% IV SOLN
2.0000 g | INTRAVENOUS | Status: AC
  Administered 2023-08-16: 2 g via INTRAVENOUS
  Filled 2023-08-15: qty 100

## 2023-08-15 MED ORDER — MAGNESIUM SULFATE 50 % IJ SOLN
40.0000 meq | INTRAMUSCULAR | Status: DC
  Filled 2023-08-15: qty 9.85

## 2023-08-15 MED ORDER — PLASMA-LYTE A IV SOLN
INTRAVENOUS | Status: DC
  Filled 2023-08-15: qty 5

## 2023-08-15 MED ORDER — TRANEXAMIC ACID (OHS) PUMP PRIME SOLUTION
2.0000 mg/kg | INTRAVENOUS | Status: DC
  Filled 2023-08-15: qty 1.61

## 2023-08-15 MED ORDER — TEMAZEPAM 15 MG PO CAPS
15.0000 mg | ORAL_CAPSULE | Freq: Once | ORAL | Status: DC | PRN
Start: 2023-08-15 — End: 2023-08-16

## 2023-08-15 MED ORDER — INSULIN REGULAR(HUMAN) IN NACL 100-0.9 UT/100ML-% IV SOLN
INTRAVENOUS | Status: AC
  Administered 2023-08-16: 1.6 [IU]/h via INTRAVENOUS
  Filled 2023-08-15: qty 100

## 2023-08-15 MED ORDER — DEXMEDETOMIDINE HCL IN NACL 400 MCG/100ML IV SOLN
0.1000 ug/kg/h | INTRAVENOUS | Status: AC
  Administered 2023-08-16: .7 ug/kg/h via INTRAVENOUS
  Filled 2023-08-15: qty 100

## 2023-08-15 MED ORDER — NOREPINEPHRINE 4 MG/250ML-% IV SOLN
0.0000 ug/min | INTRAVENOUS | Status: DC
  Filled 2023-08-15: qty 250

## 2023-08-15 MED ORDER — NITROGLYCERIN IN D5W 200-5 MCG/ML-% IV SOLN
2.0000 ug/min | INTRAVENOUS | Status: DC
  Filled 2023-08-15: qty 250

## 2023-08-15 MED ORDER — PHENYLEPHRINE HCL-NACL 20-0.9 MG/250ML-% IV SOLN
30.0000 ug/min | INTRAVENOUS | Status: AC
  Administered 2023-08-16: 50 ug/min via INTRAVENOUS
  Filled 2023-08-15: qty 250

## 2023-08-15 MED ORDER — EPINEPHRINE HCL 5 MG/250ML IV SOLN IN NS
0.0000 ug/min | INTRAVENOUS | Status: DC
  Filled 2023-08-15: qty 250

## 2023-08-15 MED ORDER — METOPROLOL TARTRATE 12.5 MG HALF TABLET
12.5000 mg | ORAL_TABLET | Freq: Once | ORAL | Status: AC
  Administered 2023-08-16: 12.5 mg via ORAL
  Filled 2023-08-15: qty 1

## 2023-08-15 MED ORDER — BISACODYL 5 MG PO TBEC
5.0000 mg | DELAYED_RELEASE_TABLET | Freq: Once | ORAL | Status: AC
  Administered 2023-08-15: 5 mg via ORAL
  Filled 2023-08-15: qty 1

## 2023-08-15 MED ORDER — POTASSIUM CHLORIDE 2 MEQ/ML IV SOLN
80.0000 meq | INTRAVENOUS | Status: DC
  Filled 2023-08-15: qty 40

## 2023-08-15 MED ORDER — MILRINONE LACTATE IN DEXTROSE 20-5 MG/100ML-% IV SOLN
0.3000 ug/kg/min | INTRAVENOUS | Status: DC
  Filled 2023-08-15: qty 100

## 2023-08-15 MED ORDER — HEPARIN 30,000 UNITS/1000 ML (OHS) CELLSAVER SOLUTION
Status: DC
  Filled 2023-08-15: qty 1000

## 2023-08-15 MED ORDER — TRANEXAMIC ACID (OHS) BOLUS VIA INFUSION
15.0000 mg/kg | INTRAVENOUS | Status: AC
  Administered 2023-08-16: 1204.5 mg via INTRAVENOUS
  Filled 2023-08-15: qty 1205

## 2023-08-15 MED ORDER — DIAZEPAM 5 MG PO TABS
5.0000 mg | ORAL_TABLET | Freq: Once | ORAL | Status: AC
  Administered 2023-08-16: 5 mg via ORAL
  Filled 2023-08-15: qty 1

## 2023-08-15 NOTE — Progress Notes (Signed)
VASCULAR LAB    Pre CABG Dopplers have been performed.  See CV proc for preliminary results.   Taos Tapp, RVT 08/15/2023, 5:18 PM

## 2023-08-15 NOTE — Progress Notes (Signed)
   Patient Name: Rickey Lucas Date of Encounter: 08/15/2023 [facility] Cardiologist: Thurmon Fair, MD   Interval Summary  .    Feeling well this morning. No chest pain.  Vital Signs .    Vitals:   08/14/23 2011 08/14/23 2318 08/15/23 0321 08/15/23 0813  BP: 110/72 102/81 114/80 104/84  Pulse: 69 64 64 78  Resp: 20 20 20 18   Temp: 98 F (36.7 C) 98.3 F (36.8 C) 97.8 F (36.6 C) 98.8 F (37.1 C)  TempSrc: Oral Oral Oral Oral  SpO2: 92% 92% 93% 93%  Weight:      Height:        Intake/Output Summary (Last 24 hours) at 08/15/2023 0913 Last data filed at 08/15/2023 0816 Gross per 24 hour  Intake 956.31 ml  Output --  Net 956.31 ml      08/14/2023    6:44 AM 08/12/2023   10:14 AM 08/09/2023    8:46 AM  Last 3 Weights  Weight (lbs) 177 lb 0.5 oz 183 lb 180 lb  Weight (kg) 80.3 kg 83.008 kg 81.647 kg      Telemetry/ECG    Sinus Rhythm - Personally Reviewed  Physical Exam .   GEN: No acute distress.   Neck: No JVD Cardiac: RRR, no murmurs, rubs, or gallops.  Respiratory: Clear to auscultation bilaterally. GI: Soft, nontender, non-distended  MS: No edema  Assessment & Plan .     66 y.o. male with a hx of  syncope 2019 felt vasovagal, CAD w/ CABG planned 12/13, HLD, FH CAD and cardiac arrest in his father at age 17, who was seen 08/13/2023 for the evaluation of Unstable angina at the request of Dr Jeraldine Loots.    Unstable Angina CAD -- initially underwent outpatient cath with planned outpatient CABG on 12/13. Presented back with chest pain. Will remain inpatient until surgery. Pending pre CABG dopplers later today. -- continue IV heparin, ASA, Toprol, Crestor and NTG paste    HLD -- increased crestor to 20mg  daily -- LDL 62  -- will need FLP/LFTs in 8 weeks   Intraductal papillary pancreatic cyst, abnl LFTs -- pt says LFTs are chronically mildly abnl -- cyst is followed by Dr Cherlyn Imogene Gravelle in [place], has been stable   Hyponatremia --  reports he had increases his water intake post cath -- improved 128>> 132    For questions or updates, please contact [facility] Please consult [email or web address] for contact info under        Signed, Laverda Page, NP

## 2023-08-15 NOTE — Progress Notes (Signed)
ANTICOAGULATION CONSULT NOTE - Follow-up Note  Pharmacy Consult for Heparin Indication: chest pain/ACS  No Known Allergies  Patient Measurements: Height: 5\' 11"  (180.3 cm) Weight: 80.3 kg (177 lb 0.5 oz) IBW/kg (Calculated) : 75.3 Heparin Dosing Weight: 83 kg  Vital Signs: Temp: 97.8 F (36.6 C) (12/12 0321) Temp Source: Oral (12/12 0321) BP: 114/80 (12/12 0321) Pulse Rate: 64 (12/12 0321)  Labs: Recent Labs    08/13/23 1108 08/13/23 1323 08/13/23 1445 08/13/23 2102 08/14/23 0539 08/15/23 0300  HGB 15.6  --   --   --  15.7 15.1  HCT 46.0  --   --   --  45.4 44.6  PLT 286  --   --   --  270 263  HEPARINUNFRC  --   --    < > 0.25* 0.45 0.44  CREATININE 0.95  --   --   --  0.82  --   TROPONINIHS 5 6  --   --   --   --    < > = values in this interval not displayed.    Estimated Creatinine Clearance: 91.8 mL/min (by C-G formula based on SCr of 0.82 mg/dL).   Medical History: Past Medical History:  Diagnosis Date   Asthma    CAD (coronary artery disease), native coronary artery 08/09/2023   abnl cardiac CT >> Cath >>  CABG 08/2023   Family history of premature CAD    HLD (hyperlipidemia)    intraductal papillary Pancreatic cyst     Medications:  Medications Prior to Admission  Medication Sig Dispense Refill Last Dose/Taking   acetaminophen (TYLENOL) 500 MG tablet Take 500 mg by mouth every 6 (six) hours as needed for mild pain (pain score 1-3) or moderate pain (pain score 4-6).   Past Month   albuterol (PROAIR HFA) 108 (90 Base) MCG/ACT inhaler Inhale 2 puffs into the lungs every 6 (six) hours as needed for wheezing or shortness of breath.   08/13/2023   Ascorbic Acid (VITAMIN C) 1000 MG tablet Take 500 mg by mouth 2 (two) times daily.   08/13/2023   aspirin EC 81 MG tablet Take 1 tablet (81 mg total) by mouth daily. Swallow whole.   08/13/2023   Cholecalciferol (VITAMIN D-3) 125 MCG (5000 UT) TABS Take 5,000 Units by mouth See admin instructions. Twice a week-  Sunday and Thursday   08/12/2023   Coenzyme Q10 100 MG capsule Take 100 mg by mouth 2 (two) times daily.   08/13/2023   metoprolol succinate (TOPROL XL) 50 MG 24 hr tablet Take 1 tablet (50 mg total) by mouth daily. 90 tablet 3 08/13/2023   Multiple Vitamin (MULTIVITAMIN) tablet Take 1 tablet by mouth daily.   08/13/2023   rosuvastatin (CRESTOR) 10 MG tablet Take 10 mg by mouth daily.   08/12/2023   TURMERIC PO Take 500 mg by mouth daily at 12 noon.   08/13/2023   vitamin E 180 MG (400 UNITS) capsule Take 400 Units by mouth 2 (two) times daily.   08/13/2023   Scheduled:   vitamin C  500 mg Oral BID   aspirin EC  81 mg Oral Daily   metoprolol succinate  50 mg Oral Daily   multivitamin with minerals  1 tablet Oral Daily   nitroGLYCERIN  1 inch Topical Q6H   rosuvastatin  20 mg Oral Daily   sodium chloride flush  3 mL Intravenous Q12H   vitamin E  400 Units Oral BID   Infusions:   heparin  1,250 Units/hr (08/15/23 0528)   PRN: acetaminophen, albuterol, ALPRAZolam, nitroGLYCERIN, ondansetron (ZOFRAN) IV, sodium chloride flush, zolpidem  Assessment: 67 yom presenting with chest pain and weakness. Heparin per pharmacy consult placed for chest pain/ACS. He has LM disease and plans are for CABG on 12/13  -heparin level at goal on 1250 units/hr, CBC stable  Goal of Therapy:  Heparin level 0.3-0.7 units/ml Monitor platelets by anticoagulation protocol: Yes   Plan:  -Continue heparin at 1250 units/hr -Daily heparin level and CBC  Harland German, PharmD Clinical Pharmacist **Pharmacist phone directory can now be found on [email or web address] ([email or web address]).  Listed under [facility].

## 2023-08-15 NOTE — Progress Notes (Signed)
CARDIAC REHAB PHASE I      Pre-op OHS education including OHS booklet, OHS handout, IS use, mobility importance, home needs at discharge and sternal precautions/move in the tube reviewed. All questions and concerns addressed. Will continue to follow.  [id number] Woodroe Chen, RN BSN 08/15/2023 12:05 PM

## 2023-08-16 ENCOUNTER — Encounter (HOSPITAL_COMMUNITY)
Admission: EM | Disposition: A | Discharge status: Home / Self Care | Payer: Self-pay | Source: Home / Self Care | Attending: Surgery

## 2023-08-16 ENCOUNTER — Inpatient Hospital Stay (HOSPITAL_COMMUNITY): Payer: Medicare Other | Admitting: Anesthesiology

## 2023-08-16 ENCOUNTER — Other Ambulatory Visit: Payer: Self-pay

## 2023-08-16 ENCOUNTER — Inpatient Hospital Stay (HOSPITAL_COMMUNITY): Payer: Medicare Other

## 2023-08-16 ENCOUNTER — Inpatient Hospital Stay (HOSPITAL_COMMUNITY): Admission: RE | Admit: 2023-08-16 | Payer: Medicare Other | Source: Home / Self Care | Admitting: Surgery

## 2023-08-16 DIAGNOSIS — I2510 Atherosclerotic heart disease of native coronary artery without angina pectoris: Secondary | ICD-10-CM

## 2023-08-16 DIAGNOSIS — Z951 Presence of aortocoronary bypass graft: Secondary | ICD-10-CM

## 2023-08-16 HISTORY — PX: CORONARY ARTERY BYPASS GRAFT: SHX141

## 2023-08-16 HISTORY — PX: TEE WITHOUT CARDIOVERSION: SHX5443

## 2023-08-16 LAB — POCT I-STAT, CHEM 8
BUN: 12 mg/dL (ref 8–23)
BUN: 11 mg/dL (ref 8–23)
BUN: 11 mg/dL (ref 8–23)
BUN: 11 mg/dL (ref 8–23)
Calcium, Ion: 1.23 mmol/L (ref 1.15–1.40)
Calcium, Ion: 1.23 mmol/L (ref 1.15–1.40)
Calcium, Ion: 1.04 mmol/L — ABNORMAL LOW (ref 1.15–1.40)
Calcium, Ion: 1.08 mmol/L — ABNORMAL LOW (ref 1.15–1.40)
Chloride: 98 mmol/L (ref 98–111)
Chloride: 99 mmol/L (ref 98–111)
Chloride: 96 mmol/L — ABNORMAL LOW (ref 98–111)
Chloride: 98 mmol/L (ref 98–111)
Creatinine, Ser: 0.8 mg/dL (ref 0.61–1.24)
Creatinine, Ser: 0.8 mg/dL (ref 0.61–1.24)
Creatinine, Ser: 0.6 mg/dL — ABNORMAL LOW (ref 0.61–1.24)
Creatinine, Ser: 0.7 mg/dL (ref 0.61–1.24)
Glucose, Bld: 140 mg/dL — ABNORMAL HIGH (ref 70–99)
Glucose, Bld: 101 mg/dL — ABNORMAL HIGH (ref 70–99)
Glucose, Bld: 95 mg/dL (ref 70–99)
Glucose, Bld: 145 mg/dL — ABNORMAL HIGH (ref 70–99)
HCT: 44 % (ref 39.0–52.0)
HCT: 40 % (ref 39.0–52.0)
HCT: 30 % — ABNORMAL LOW (ref 39.0–52.0)
HCT: 33 % — ABNORMAL LOW (ref 39.0–52.0)
Hemoglobin: 15 g/dL (ref 13.0–17.0)
Hemoglobin: 13.6 g/dL (ref 13.0–17.0)
Hemoglobin: 10.2 g/dL — ABNORMAL LOW (ref 13.0–17.0)
Hemoglobin: 11.2 g/dL — ABNORMAL LOW (ref 13.0–17.0)
Potassium: 3.6 mmol/L (ref 3.5–5.1)
Potassium: 3.9 mmol/L (ref 3.5–5.1)
Potassium: 4.5 mmol/L (ref 3.5–5.1)
Potassium: 4.5 mmol/L (ref 3.5–5.1)
Sodium: 135 mmol/L (ref 135–145)
Sodium: 136 mmol/L (ref 135–145)
Sodium: 132 mmol/L — ABNORMAL LOW (ref 135–145)
Sodium: 133 mmol/L — ABNORMAL LOW (ref 135–145)
TCO2: 25 mmol/L (ref 22–32)
TCO2: 24 mmol/L (ref 22–32)
TCO2: 26 mmol/L (ref 22–32)
TCO2: 24 mmol/L (ref 22–32)

## 2023-08-16 LAB — BASIC METABOLIC PANEL
Anion gap: 8 (ref 5–15)
Anion gap: 5 (ref 5–15)
BUN: 13 mg/dL (ref 8–23)
BUN: 11 mg/dL (ref 8–23)
CO2: 23 mmol/L (ref 22–32)
CO2: 25 mmol/L (ref 22–32)
Calcium: 8.5 mg/dL — ABNORMAL LOW (ref 8.9–10.3)
Calcium: 7.5 mg/dL — ABNORMAL LOW (ref 8.9–10.3)
Chloride: 102 mmol/L (ref 98–111)
Chloride: 106 mmol/L (ref 98–111)
Creatinine, Ser: 0.85 mg/dL (ref 0.61–1.24)
Creatinine, Ser: 0.9 mg/dL (ref 0.61–1.24)
GFR, Estimated: >60 mL/min (ref >60)
GFR, Estimated: >60 mL/min (ref >60)
Glucose, Bld: 102 mg/dL — ABNORMAL HIGH (ref 70–99)
Glucose, Bld: 128 mg/dL — ABNORMAL HIGH (ref 70–99)
Potassium: 3.7 mmol/L (ref 3.5–5.1)
Potassium: 3.9 mmol/L (ref 3.5–5.1)
Sodium: 133 mmol/L — ABNORMAL LOW (ref 135–145)
Sodium: 136 mmol/L (ref 135–145)

## 2023-08-16 LAB — POCT I-STAT 7, (LYTES, BLD GAS, ICA,H+H)
Acid-Base Excess: 0 mmol/L (ref 0.0–2.0)
Acid-base deficit: 1 mmol/L (ref 0.0–2.0)
Acid-base deficit: 1 mmol/L (ref 0.0–2.0)
Acid-base deficit: 1 mmol/L (ref 0.0–2.0)
Acid-base deficit: 3 mmol/L — ABNORMAL HIGH (ref 0.0–2.0)
Acid-base deficit: 3 mmol/L — ABNORMAL HIGH (ref 0.0–2.0)
Acid-base deficit: 2 mmol/L (ref 0.0–2.0)
Bicarbonate: 24.6 mmol/L (ref 20.0–28.0)
Bicarbonate: 24.9 mmol/L (ref 20.0–28.0)
Bicarbonate: 24.1 mmol/L (ref 20.0–28.0)
Bicarbonate: 24.5 mmol/L (ref 20.0–28.0)
Bicarbonate: 22.3 mmol/L (ref 20.0–28.0)
Bicarbonate: 22.2 mmol/L (ref 20.0–28.0)
Bicarbonate: 23.7 mmol/L (ref 20.0–28.0)
Calcium, Ion: 1.23 mmol/L (ref 1.15–1.40)
Calcium, Ion: 1.01 mmol/L — ABNORMAL LOW (ref 1.15–1.40)
Calcium, Ion: 1.06 mmol/L — ABNORMAL LOW (ref 1.15–1.40)
Calcium, Ion: 1.09 mmol/L — ABNORMAL LOW (ref 1.15–1.40)
Calcium, Ion: 1.08 mmol/L — ABNORMAL LOW (ref 1.15–1.40)
Calcium, Ion: 1.07 mmol/L — ABNORMAL LOW (ref 1.15–1.40)
Calcium, Ion: 1.09 mmol/L — ABNORMAL LOW (ref 1.15–1.40)
HCT: 42 % (ref 39.0–52.0)
HCT: 33 % — ABNORMAL LOW (ref 39.0–52.0)
HCT: 33 % — ABNORMAL LOW (ref 39.0–52.0)
HCT: 34 % — ABNORMAL LOW (ref 39.0–52.0)
HCT: 35 % — ABNORMAL LOW (ref 39.0–52.0)
HCT: 36 % — ABNORMAL LOW (ref 39.0–52.0)
HCT: 34 % — ABNORMAL LOW (ref 39.0–52.0)
Hemoglobin: 14.3 g/dL (ref 13.0–17.0)
Hemoglobin: 11.2 g/dL — ABNORMAL LOW (ref 13.0–17.0)
Hemoglobin: 11.2 g/dL — ABNORMAL LOW (ref 13.0–17.0)
Hemoglobin: 11.6 g/dL — ABNORMAL LOW (ref 13.0–17.0)
Hemoglobin: 11.9 g/dL — ABNORMAL LOW (ref 13.0–17.0)
Hemoglobin: 12.2 g/dL — ABNORMAL LOW (ref 13.0–17.0)
Hemoglobin: 11.6 g/dL — ABNORMAL LOW (ref 13.0–17.0)
O2 Saturation: 100 %
O2 Saturation: 100 %
O2 Saturation: 100 %
O2 Saturation: 100 %
O2 Saturation: 98 %
O2 Saturation: 95 %
O2 Saturation: 95 %
Patient temperature: 36.6
Patient temperature: 37.9
Patient temperature: 38.6
Potassium: 3.6 mmol/L (ref 3.5–5.1)
Potassium: 4.2 mmol/L (ref 3.5–5.1)
Potassium: 4.5 mmol/L (ref 3.5–5.1)
Potassium: 5.1 mmol/L (ref 3.5–5.1)
Potassium: 3.7 mmol/L (ref 3.5–5.1)
Potassium: 4.3 mmol/L (ref 3.5–5.1)
Potassium: 3.9 mmol/L (ref 3.5–5.1)
Sodium: 135 mmol/L (ref 135–145)
Sodium: 136 mmol/L (ref 135–145)
Sodium: 133 mmol/L — ABNORMAL LOW (ref 135–145)
Sodium: 134 mmol/L — ABNORMAL LOW (ref 135–145)
Sodium: 138 mmol/L (ref 135–145)
Sodium: 138 mmol/L (ref 135–145)
Sodium: 138 mmol/L (ref 135–145)
TCO2: 26 mmol/L (ref 22–32)
TCO2: 26 mmol/L (ref 22–32)
TCO2: 25 mmol/L (ref 22–32)
TCO2: 26 mmol/L (ref 22–32)
TCO2: 23 mmol/L (ref 22–32)
TCO2: 23 mmol/L (ref 22–32)
TCO2: 25 mmol/L (ref 22–32)
pCO2 arterial: 44.1 mm[Hg] (ref 32–48)
pCO2 arterial: 45 mm[Hg] (ref 32–48)
pCO2 arterial: 37 mm[Hg] (ref 32–48)
pCO2 arterial: 40.7 mm[Hg] (ref 32–48)
pCO2 arterial: 39.2 mm[Hg] (ref 32–48)
pCO2 arterial: 41.1 mm[Hg] (ref 32–48)
pCO2 arterial: 48.3 mm[Hg] — ABNORMAL HIGH (ref 32–48)
pH, Arterial: 7.354 (ref 7.35–7.45)
pH, Arterial: 7.352 (ref 7.35–7.45)
pH, Arterial: 7.38 (ref 7.35–7.45)
pH, Arterial: 7.429 (ref 7.35–7.45)
pH, Arterial: 7.36 (ref 7.35–7.45)
pH, Arterial: 7.344 — ABNORMAL LOW (ref 7.35–7.45)
pH, Arterial: 7.307 — ABNORMAL LOW (ref 7.35–7.45)
pO2, Arterial: 369 mm[Hg] — ABNORMAL HIGH (ref 83–108)
pO2, Arterial: 424 mm[Hg] — ABNORMAL HIGH (ref 83–108)
pO2, Arterial: 174 mm[Hg] — ABNORMAL HIGH (ref 83–108)
pO2, Arterial: 353 mm[Hg] — ABNORMAL HIGH (ref 83–108)
pO2, Arterial: 117 mm[Hg] — ABNORMAL HIGH (ref 83–108)
pO2, Arterial: 87 mm[Hg] (ref 83–108)
pO2, Arterial: 92 mm[Hg] (ref 83–108)

## 2023-08-16 LAB — CBC
HCT: 43.1 % (ref 39.0–52.0)
HCT: 35.5 % — ABNORMAL LOW (ref 39.0–52.0)
HCT: 33.9 % — ABNORMAL LOW (ref 39.0–52.0)
Hemoglobin: 14.8 g/dL (ref 13.0–17.0)
Hemoglobin: 12.4 g/dL — ABNORMAL LOW (ref 13.0–17.0)
Hemoglobin: 11.8 g/dL — ABNORMAL LOW (ref 13.0–17.0)
MCH: 32 pg (ref 26.0–34.0)
MCH: 32.8 pg (ref 26.0–34.0)
MCH: 33.1 pg (ref 26.0–34.0)
MCHC: 34.3 g/dL (ref 30.0–36.0)
MCHC: 34.9 g/dL (ref 30.0–36.0)
MCHC: 34.8 g/dL (ref 30.0–36.0)
MCV: 93.1 fL (ref 80.0–100.0)
MCV: 93.9 fL (ref 80.0–100.0)
MCV: 95 fL (ref 80.0–100.0)
Platelets: 241 10*3/uL (ref 150–400)
Platelets: 184 10*3/uL (ref 150–400)
Platelets: 179 10*3/uL (ref 150–400)
RBC: 4.63 MIL/uL (ref 4.22–5.81)
RBC: 3.78 MIL/uL — ABNORMAL LOW (ref 4.22–5.81)
RBC: 3.57 MIL/uL — ABNORMAL LOW (ref 4.22–5.81)
RDW: 13.3 % (ref 11.5–15.5)
RDW: 13.3 % (ref 11.5–15.5)
RDW: 13.3 % (ref 11.5–15.5)
WBC: 10.2 10*3/uL (ref 4.0–10.5)
WBC: 14.4 10*3/uL — ABNORMAL HIGH (ref 4.0–10.5)
WBC: 12.9 10*3/uL — ABNORMAL HIGH (ref 4.0–10.5)
nRBC: 0 % (ref 0.0–0.2)
nRBC: 0 % (ref 0.0–0.2)
nRBC: 0 % (ref 0.0–0.2)

## 2023-08-16 LAB — HEMOGLOBIN AND HEMATOCRIT, BLOOD
HCT: 32.7 % — ABNORMAL LOW (ref 39.0–52.0)
Hemoglobin: 11.2 g/dL — ABNORMAL LOW (ref 13.0–17.0)

## 2023-08-16 LAB — POCT I-STAT EG7
Acid-Base Excess: 0 mmol/L (ref 0.0–2.0)
Bicarbonate: 26.1 mmol/L (ref 20.0–28.0)
Calcium, Ion: 1.02 mmol/L — ABNORMAL LOW (ref 1.15–1.40)
HCT: 31 % — ABNORMAL LOW (ref 39.0–52.0)
Hemoglobin: 10.5 g/dL — ABNORMAL LOW (ref 13.0–17.0)
O2 Saturation: 86 %
Potassium: 5.4 mmol/L — ABNORMAL HIGH (ref 3.5–5.1)
Sodium: 136 mmol/L (ref 135–145)
TCO2: 28 mmol/L (ref 22–32)
pCO2, Ven: 47.2 mm[Hg] (ref 44–60)
pH, Ven: 7.351 (ref 7.25–7.43)
pO2, Ven: 54 mm[Hg] — ABNORMAL HIGH (ref 32–45)

## 2023-08-16 LAB — PROTIME-INR
INR: 1.4 — ABNORMAL HIGH (ref 0.8–1.2)
Prothrombin Time: 16.9 s — ABNORMAL HIGH (ref 11.4–15.2)

## 2023-08-16 LAB — GLUCOSE, CAPILLARY
Glucose-Capillary: 121 mg/dL — ABNORMAL HIGH (ref 70–99)
Glucose-Capillary: 116 mg/dL — ABNORMAL HIGH (ref 70–99)
Glucose-Capillary: 117 mg/dL — ABNORMAL HIGH (ref 70–99)
Glucose-Capillary: 103 mg/dL — ABNORMAL HIGH (ref 70–99)
Glucose-Capillary: 100 mg/dL — ABNORMAL HIGH (ref 70–99)
Glucose-Capillary: 109 mg/dL — ABNORMAL HIGH (ref 70–99)
Glucose-Capillary: 85 mg/dL (ref 70–99)
Glucose-Capillary: 70 mg/dL (ref 70–99)

## 2023-08-16 LAB — APTT: aPTT: 30 s (ref 24–36)

## 2023-08-16 LAB — HEPARIN LEVEL (UNFRACTIONATED): Heparin Unfractionated: 0.34 [IU]/mL (ref 0.30–0.70)

## 2023-08-16 LAB — MAGNESIUM: Magnesium: 2.9 mg/dL — ABNORMAL HIGH (ref 1.7–2.4)

## 2023-08-16 LAB — PLATELET COUNT: Platelets: 195 10*3/uL (ref 150–400)

## 2023-08-16 SURGERY — CORONARY ARTERY BYPASS GRAFTING (CABG)
Anesthesia: General | Site: Chest

## 2023-08-16 MED ORDER — INSULIN REGULAR(HUMAN) IN NACL 100-0.9 UT/100ML-% IV SOLN: INTRAVENOUS | Status: DC

## 2023-08-16 MED ORDER — FENTANYL CITRATE (PF) 250 MCG/5ML IJ SOLN
INTRAMUSCULAR | Status: AC
  Filled 2023-08-16: qty 5

## 2023-08-16 MED ORDER — SODIUM CHLORIDE 0.9% FLUSH: 3.0000 mL | INTRAVENOUS | Status: DC | PRN

## 2023-08-16 MED ORDER — THROMBIN 20000 UNITS EX SOLR
CUTANEOUS | Status: DC | PRN
  Administered 2023-08-16: 20000 [IU] via TOPICAL

## 2023-08-16 MED ORDER — CHLORHEXIDINE GLUCONATE 0.12 % MT SOLN: 15.0000 mL | OROMUCOSAL | Status: AC

## 2023-08-16 MED ORDER — ACETAMINOPHEN 500 MG PO TABS
1000.0000 mg | ORAL_TABLET | Freq: Once | ORAL | Status: AC
  Administered 2023-08-16: 1000 mg via ORAL
  Filled 2023-08-16: qty 2

## 2023-08-16 MED ORDER — PROPOFOL 10 MG/ML IV BOLUS
INTRAVENOUS | Status: DC | PRN
  Administered 2023-08-16: 80 mg via INTRAVENOUS

## 2023-08-16 MED ORDER — LIDOCAINE 2% (20 MG/ML) 5 ML SYRINGE
INTRAMUSCULAR | Status: AC
  Filled 2023-08-16: qty 5

## 2023-08-16 MED ORDER — OXYCODONE HCL 5 MG PO TABS: 5.0000 mg | ORAL_TABLET | ORAL | Status: DC | PRN

## 2023-08-16 MED ORDER — ASPIRIN 325 MG PO TBEC
325.0000 mg | DELAYED_RELEASE_TABLET | Freq: Every day | ORAL | Status: DC
  Administered 2023-08-17: 325 mg via ORAL
  Filled 2023-08-16: qty 1

## 2023-08-16 MED ORDER — CEFAZOLIN SODIUM-DEXTROSE 2-4 GM/100ML-% IV SOLN
2.0000 g | Freq: Three times a day (TID) | INTRAVENOUS | Status: AC
  Administered 2023-08-16 – 2023-08-18 (×6): 2 g via INTRAVENOUS
  Filled 2023-08-16 (×6): qty 100

## 2023-08-16 MED ORDER — FENTANYL CITRATE (PF) 250 MCG/5ML IJ SOLN
INTRAMUSCULAR | Status: DC | PRN
  Administered 2023-08-16: 50 ug via INTRAVENOUS
  Administered 2023-08-16: 100 ug via INTRAVENOUS
  Administered 2023-08-16: 200 ug via INTRAVENOUS

## 2023-08-16 MED ORDER — ALBUMIN HUMAN 5 % IV SOLN: INTRAVENOUS | Status: DC | PRN

## 2023-08-16 MED ORDER — SODIUM CHLORIDE 0.9% FLUSH
10.0000 mL | Freq: Two times a day (BID) | INTRAVENOUS | Status: DC
  Administered 2023-08-16 – 2023-08-17 (×4): 10 mL via INTRAVENOUS

## 2023-08-16 MED ORDER — ONDANSETRON HCL 4 MG/2ML IJ SOLN
INTRAMUSCULAR | Status: AC
  Filled 2023-08-16: qty 2

## 2023-08-16 MED ORDER — ROCURONIUM BROMIDE 10 MG/ML (PF) SYRINGE
PREFILLED_SYRINGE | INTRAVENOUS | Status: DC | PRN
  Administered 2023-08-16: 50 mg via INTRAVENOUS
  Administered 2023-08-16: 30 mg via INTRAVENOUS
  Administered 2023-08-16: 100 mg via INTRAVENOUS

## 2023-08-16 MED ORDER — MAGNESIUM SULFATE 4 GM/100ML IV SOLN
INTRAVENOUS | Status: AC
  Filled 2023-08-16: qty 100

## 2023-08-16 MED ORDER — MIDAZOLAM HCL (PF) 10 MG/2ML IJ SOLN
INTRAMUSCULAR | Status: AC
  Filled 2023-08-16: qty 2

## 2023-08-16 MED ORDER — LACTATED RINGERS IV SOLN: INTRAVENOUS | Status: DC | PRN

## 2023-08-16 MED ORDER — HEPARIN SODIUM (PORCINE) 1000 UNIT/ML IJ SOLN
INTRAMUSCULAR | Status: AC
  Filled 2023-08-16: qty 1

## 2023-08-16 MED ORDER — OXYCODONE HCL 5 MG PO TABS
5.0000 mg | ORAL_TABLET | ORAL | Status: DC | PRN
Start: 2023-08-16 — End: 2023-08-21
  Administered 2023-08-17 – 2023-08-20 (×15): 5 mg via ORAL
  Filled 2023-08-16 (×16): qty 1

## 2023-08-16 MED ORDER — ASPIRIN 81 MG PO CHEW
324.0000 mg | CHEWABLE_TABLET | Freq: Once | ORAL | Status: AC
  Administered 2023-08-16: 324 mg via ORAL
  Filled 2023-08-16: qty 4

## 2023-08-16 MED ORDER — ACETAMINOPHEN 500 MG PO TABS
1000.0000 mg | ORAL_TABLET | Freq: Four times a day (QID) | ORAL | Status: DC
  Administered 2023-08-16 – 2023-08-21 (×17): 1000 mg via ORAL
  Filled 2023-08-16 (×17): qty 2

## 2023-08-16 MED ORDER — DOCUSATE SODIUM 100 MG PO CAPS
200.0000 mg | ORAL_CAPSULE | Freq: Every day | ORAL | Status: DC
  Administered 2023-08-17: 200 mg via ORAL
  Filled 2023-08-16: qty 2

## 2023-08-16 MED ORDER — ONDANSETRON HCL 4 MG/2ML IJ SOLN
4.0000 mg | Freq: Four times a day (QID) | INTRAMUSCULAR | Status: DC | PRN
  Administered 2023-08-17 – 2023-08-18 (×3): 4 mg via INTRAVENOUS
  Filled 2023-08-16 (×3): qty 2

## 2023-08-16 MED ORDER — PROTAMINE SULFATE 10 MG/ML IV SOLN
INTRAVENOUS | Status: AC
  Filled 2023-08-16: qty 5

## 2023-08-16 MED ORDER — POTASSIUM CHLORIDE 10 MEQ/50ML IV SOLN
10.0000 meq | INTRAVENOUS | Status: AC
  Administered 2023-08-16 (×3): 10 meq via INTRAVENOUS

## 2023-08-16 MED ORDER — LACTATED RINGERS IV SOLN: INTRAVENOUS | Status: AC

## 2023-08-16 MED ORDER — SODIUM CHLORIDE (PF) 0.9 % IJ SOLN: OROMUCOSAL | Status: DC | PRN

## 2023-08-16 MED ORDER — ACETAMINOPHEN 160 MG/5ML PO SOLN: 650.0000 mg | Freq: Once | ORAL | Status: DC

## 2023-08-16 MED ORDER — ONDANSETRON HCL 4 MG/2ML IJ SOLN
INTRAMUSCULAR | Status: DC | PRN
  Administered 2023-08-16: 4 mg via INTRAVENOUS

## 2023-08-16 MED ORDER — METOPROLOL TARTRATE 12.5 MG HALF TABLET: 12.5000 mg | ORAL_TABLET | Freq: Two times a day (BID) | ORAL | Status: DC

## 2023-08-16 MED ORDER — BISACODYL 5 MG PO TBEC
10.0000 mg | DELAYED_RELEASE_TABLET | Freq: Every day | ORAL | Status: DC
  Administered 2023-08-17: 10 mg via ORAL
  Filled 2023-08-16: qty 2

## 2023-08-16 MED ORDER — PROPOFOL 10 MG/ML IV BOLUS
INTRAVENOUS | Status: AC
  Filled 2023-08-16: qty 20

## 2023-08-16 MED ORDER — 0.9 % SODIUM CHLORIDE (POUR BTL) OPTIME
TOPICAL | Status: DC | PRN
  Administered 2023-08-16: 5000 mL

## 2023-08-16 MED ORDER — SUGAMMADEX SODIUM 200 MG/2ML IV SOLN
INTRAVENOUS | Status: DC | PRN
  Administered 2023-08-16: 160 mg via INTRAVENOUS

## 2023-08-16 MED ORDER — METOPROLOL TARTRATE 5 MG/5ML IV SOLN
2.5000 mg | INTRAVENOUS | Status: DC | PRN
  Administered 2023-08-16: 2.5 mg via INTRAVENOUS
  Filled 2023-08-16: qty 5

## 2023-08-16 MED ORDER — MORPHINE SULFATE (PF) 2 MG/ML IV SOLN
1.0000 mg | INTRAVENOUS | Status: DC | PRN
  Administered 2023-08-16: 1 mg via INTRAVENOUS
  Administered 2023-08-17 (×2): 4 mg via INTRAVENOUS
  Administered 2023-08-17: 2 mg via INTRAVENOUS
  Administered 2023-08-17 – 2023-08-18 (×3): 4 mg via INTRAVENOUS
  Filled 2023-08-16: qty 1
  Filled 2023-08-16: qty 2
  Filled 2023-08-16 (×7): qty 1
  Filled 2023-08-16: qty 2

## 2023-08-16 MED ORDER — METOPROLOL TARTRATE 25 MG/10 ML ORAL SUSPENSION: 12.5000 mg | Freq: Two times a day (BID) | ORAL | Status: DC

## 2023-08-16 MED ORDER — NITROGLYCERIN IN D5W 200-5 MCG/ML-% IV SOLN: 0.0000 ug/min | INTRAVENOUS | Status: DC

## 2023-08-16 MED ORDER — CHLORHEXIDINE GLUCONATE 0.12 % MT SOLN
OROMUCOSAL | Status: AC
  Administered 2023-08-16: 15 mL
  Filled 2023-08-16: qty 15

## 2023-08-16 MED ORDER — PANTOPRAZOLE SODIUM 40 MG PO TBEC
40.0000 mg | DELAYED_RELEASE_TABLET | Freq: Every day | ORAL | Status: DC
  Administered 2023-08-18 – 2023-08-21 (×4): 40 mg via ORAL
  Filled 2023-08-16 (×4): qty 1

## 2023-08-16 MED ORDER — PANTOPRAZOLE SODIUM 40 MG IV SOLR
40.0000 mg | Freq: Every day | INTRAVENOUS | Status: AC
  Administered 2023-08-16 – 2023-08-17 (×2): 40 mg via INTRAVENOUS
  Filled 2023-08-16 (×2): qty 10

## 2023-08-16 MED ORDER — VANCOMYCIN HCL IN DEXTROSE 1-5 GM/200ML-% IV SOLN
1000.0000 mg | Freq: Once | INTRAVENOUS | Status: AC
  Administered 2023-08-16: 1000 mg via INTRAVENOUS
  Filled 2023-08-16: qty 200

## 2023-08-16 MED ORDER — MIDAZOLAM HCL (PF) 5 MG/ML IJ SOLN
INTRAMUSCULAR | Status: DC | PRN
  Administered 2023-08-16: 2 mg via INTRAVENOUS

## 2023-08-16 MED ORDER — SODIUM CHLORIDE 0.9 % IV SOLN: INTRAVENOUS | Status: DC

## 2023-08-16 MED ORDER — SODIUM CHLORIDE 0.9% FLUSH
3.0000 mL | Freq: Two times a day (BID) | INTRAVENOUS | Status: DC
  Administered 2023-08-17 (×2): 3 mL via INTRAVENOUS

## 2023-08-16 MED ORDER — PHENYLEPHRINE 80 MCG/ML (10ML) SYRINGE FOR IV PUSH (FOR BLOOD PRESSURE SUPPORT)
PREFILLED_SYRINGE | INTRAVENOUS | Status: DC | PRN
  Administered 2023-08-16: 40 ug via INTRAVENOUS
  Administered 2023-08-16: 80 ug via INTRAVENOUS
  Administered 2023-08-16: 40 ug via INTRAVENOUS
  Administered 2023-08-16: 80 ug via INTRAVENOUS
  Administered 2023-08-16: 40 ug via INTRAVENOUS
  Administered 2023-08-16 (×5): 80 ug via INTRAVENOUS

## 2023-08-16 MED ORDER — HEPARIN SODIUM (PORCINE) 1000 UNIT/ML IJ SOLN
INTRAMUSCULAR | Status: DC | PRN
  Administered 2023-08-16: 32000 [IU] via INTRAVENOUS

## 2023-08-16 MED ORDER — SODIUM CHLORIDE 0.9 % IV SOLN: INTRAVENOUS | Status: DC | PRN

## 2023-08-16 MED ORDER — BISACODYL 10 MG RE SUPP: 10.0000 mg | Freq: Every day | RECTAL | Status: DC

## 2023-08-16 MED ORDER — METOCLOPRAMIDE HCL 5 MG/ML IJ SOLN
10.0000 mg | Freq: Four times a day (QID) | INTRAMUSCULAR | Status: AC
  Administered 2023-08-16 (×2): 10 mg via INTRAVENOUS
  Filled 2023-08-16 (×4): qty 2

## 2023-08-16 MED ORDER — PROPOFOL 500 MG/50ML IV EMUL
INTRAVENOUS | Status: DC | PRN
  Administered 2023-08-16: 50 ug/kg/min via INTRAVENOUS

## 2023-08-16 MED ORDER — INSULIN ASPART 100 UNIT/ML IJ SOLN: 0.0000 [IU] | INTRAMUSCULAR | Status: DC

## 2023-08-16 MED ORDER — ASPIRIN 81 MG PO CHEW: 324.0000 mg | CHEWABLE_TABLET | Freq: Every day | ORAL | Status: DC

## 2023-08-16 MED ORDER — PHENYLEPHRINE HCL-NACL 20-0.9 MG/250ML-% IV SOLN
0.0000 ug/min | INTRAVENOUS | Status: DC
  Administered 2023-08-16: 140 ug/min via INTRAVENOUS
  Administered 2023-08-16: 170 ug/min via INTRAVENOUS
  Administered 2023-08-17: 30 ug/min via INTRAVENOUS
  Administered 2023-08-17: 120 ug/min via INTRAVENOUS
  Administered 2023-08-17 (×2): 150 ug/min via INTRAVENOUS
  Filled 2023-08-16 (×7): qty 250

## 2023-08-16 MED ORDER — ACETAMINOPHEN 160 MG/5ML PO SOLN: 1000.0000 mg | Freq: Four times a day (QID) | ORAL | Status: DC

## 2023-08-16 MED ORDER — DEXTROSE 50 % IV SOLN: 0.0000 mL | INTRAVENOUS | Status: DC | PRN

## 2023-08-16 MED ORDER — FAMOTIDINE IN NACL 20-0.9 MG/50ML-% IV SOLN
INTRAVENOUS | Status: AC
  Filled 2023-08-16: qty 50

## 2023-08-16 MED ORDER — MAGNESIUM SULFATE 4 GM/100ML IV SOLN
4.0000 g | Freq: Once | INTRAVENOUS | Status: AC
Start: 2023-08-16 — End: 2023-08-16
  Administered 2023-08-16: 4 g via INTRAVENOUS
  Filled 2023-08-16: qty 100

## 2023-08-16 MED ORDER — LIDOCAINE HCL (CARDIAC) PF 100 MG/5ML IV SOSY
PREFILLED_SYRINGE | INTRAVENOUS | Status: DC | PRN
  Administered 2023-08-16: 100 mg via INTRAVENOUS

## 2023-08-16 MED ORDER — METHADONE HCL IV SYRINGE 10 MG/ML FOR CABG
0.3000 mg/kg | Freq: Once | INTRAMUSCULAR | Status: AC
  Administered 2023-08-16: 23 mg via INTRAVENOUS
  Filled 2023-08-16: qty 2.3

## 2023-08-16 MED ORDER — ALBUMIN HUMAN 5 % IV SOLN
250.0000 mL | INTRAVENOUS | Status: AC | PRN
Start: 2023-08-16 — End: 2023-08-16
  Administered 2023-08-16 (×5): 12.5 g via INTRAVENOUS
  Filled 2023-08-16 (×3): qty 250

## 2023-08-16 MED ORDER — PROTAMINE SULFATE 10 MG/ML IV SOLN
INTRAVENOUS | Status: AC
  Filled 2023-08-16: qty 25

## 2023-08-16 MED ORDER — PLASMA-LYTE A IV SOLN: INTRAVENOUS | Status: DC | PRN

## 2023-08-16 MED ORDER — TRAMADOL HCL 50 MG PO TABS
50.0000 mg | ORAL_TABLET | ORAL | Status: DC | PRN
  Administered 2023-08-21: 50 mg via ORAL
  Filled 2023-08-16: qty 2

## 2023-08-16 MED ORDER — THROMBIN (RECOMBINANT) 20000 UNITS EX SOLR
CUTANEOUS | Status: AC
  Filled 2023-08-16: qty 20000

## 2023-08-16 MED ORDER — CHLORHEXIDINE GLUCONATE CLOTH 2 % EX PADS
6.0000 | MEDICATED_PAD | Freq: Every day | CUTANEOUS | Status: DC
  Administered 2023-08-16 – 2023-08-17 (×2): 6 via TOPICAL

## 2023-08-16 MED ORDER — SODIUM CHLORIDE 0.45 % IV SOLN: INTRAVENOUS | Status: DC | PRN

## 2023-08-16 MED ORDER — DEXMEDETOMIDINE HCL IN NACL 400 MCG/100ML IV SOLN
0.0000 ug/kg/h | INTRAVENOUS | Status: DC
  Administered 2023-08-16: 0.4 ug/kg/h via INTRAVENOUS

## 2023-08-16 MED ORDER — ROCURONIUM BROMIDE 10 MG/ML (PF) SYRINGE
PREFILLED_SYRINGE | INTRAVENOUS | Status: AC
  Filled 2023-08-16: qty 20

## 2023-08-16 MED ORDER — HEMOSTATIC AGENTS (NO CHARGE) OPTIME
TOPICAL | Status: DC | PRN
  Administered 2023-08-16 (×2): 1 via TOPICAL

## 2023-08-16 MED ORDER — PROTAMINE SULFATE 10 MG/ML IV SOLN
INTRAVENOUS | Status: DC | PRN
  Administered 2023-08-16: 320 mg via INTRAVENOUS

## 2023-08-16 MED ORDER — SODIUM BICARBONATE 8.4 % IV SOLN
50.0000 meq | Freq: Once | INTRAVENOUS | Status: AC
  Administered 2023-08-16: 50 meq via INTRAVENOUS

## 2023-08-16 MED ORDER — MIDAZOLAM HCL 2 MG/2ML IJ SOLN: 2.0000 mg | INTRAMUSCULAR | Status: DC | PRN

## 2023-08-16 MED ORDER — HEPARIN SODIUM (PORCINE) 1000 UNIT/ML IJ SOLN
INTRAMUSCULAR | Status: AC
  Filled 2023-08-16: qty 10

## 2023-08-16 SURGICAL SUPPLY — 99 items
BAG DECANTER FOR FLEXI CONT (MISCELLANEOUS) ×2 IMPLANT
BLADE CLIPPER SURG (BLADE) ×2 IMPLANT
BLADE STERNUM SYSTEM 6 (BLADE) ×2 IMPLANT
BNDG ELASTIC 4INX 5YD STR LF (GAUZE/BANDAGES/DRESSINGS) IMPLANT
BNDG ELASTIC 4X5.8 VLCR STR LF (GAUZE/BANDAGES/DRESSINGS) ×2 IMPLANT
BNDG ELASTIC 6INX 5YD STR LF (GAUZE/BANDAGES/DRESSINGS) IMPLANT
BNDG ELASTIC 6X5.8 VLCR STR LF (GAUZE/BANDAGES/DRESSINGS) ×2 IMPLANT
BNDG GAUZE DERMACEA FLUFF 4 (GAUZE/BANDAGES/DRESSINGS) ×2 IMPLANT
CANISTER SUCT 3000ML PPV (MISCELLANEOUS) ×2 IMPLANT
CANNULA ARTERIAL VENT 3/8 20FR (CANNULA) IMPLANT
CANNULA MC2 2 STG 36/46 CONN (CANNULA) IMPLANT
CATH ROBINSON RED A/P 18FR (CATHETERS) ×4 IMPLANT
CATH THORACIC 28FR (CATHETERS) ×2 IMPLANT
CATH THORACIC 36FR (CATHETERS) ×2 IMPLANT
CATH THORACIC 36FR RT ANG (CATHETERS) ×2 IMPLANT
CLIP TI MEDIUM 24 (CLIP) IMPLANT
CLIP TI WIDE RED SMALL 24 (CLIP) IMPLANT
CONTAINER PROTECT SURGISLUSH (MISCELLANEOUS) ×4 IMPLANT
DERMABOND ADVANCED .7 DNX12 (GAUZE/BANDAGES/DRESSINGS) IMPLANT
DRAPE SRG 135X102X78XABS (DRAPES) ×2 IMPLANT
DRAPE WARM FLUID 44X44 (DRAPES) ×2 IMPLANT
DRSG COVADERM 4X14 (GAUZE/BANDAGES/DRESSINGS) ×2 IMPLANT
ELECT BLADE 4.0 EZ CLEAN MEGAD (MISCELLANEOUS) ×2 IMPLANT
ELECT CAUTERY BLADE 6.4 (BLADE) ×2 IMPLANT
ELECT REM PT RETURN 9FT ADLT (ELECTROSURGICAL) ×4 IMPLANT
ELECTRODE BLDE 4.0 EZ CLN MEGD (MISCELLANEOUS) IMPLANT
ELECTRODE REM PT RTRN 9FT ADLT (ELECTROSURGICAL) ×4 IMPLANT
FELT TEFLON 1X6 (MISCELLANEOUS) ×2 IMPLANT
GAUZE 4X4 16PLY ~~LOC~~+RFID DBL (SPONGE) IMPLANT
GAUZE SPONGE 4X4 12PLY STRL (GAUZE/BANDAGES/DRESSINGS) ×4 IMPLANT
GLOVE BIO SURGEON STRL SZ 6 (GLOVE) IMPLANT
GLOVE BIO SURGEON STRL SZ 6.5 (GLOVE) IMPLANT
GLOVE BIO SURGEON STRL SZ7 (GLOVE) IMPLANT
GLOVE BIO SURGEON STRL SZ7.5 (GLOVE) IMPLANT
GLOVE BIOGEL PI IND STRL 6 (GLOVE) IMPLANT
GLOVE BIOGEL PI IND STRL 6.5 (GLOVE) IMPLANT
GLOVE BIOGEL PI IND STRL 7.0 (GLOVE) IMPLANT
GLOVE ECLIPSE 7.0 STRL STRAW (GLOVE) ×4 IMPLANT
GLOVE ORTHO TXT STRL SZ7.5 (GLOVE) IMPLANT
GOWN STRL REUS W/ TWL LRG LVL3 (GOWN DISPOSABLE) ×8 IMPLANT
GOWN STRL REUS W/ TWL XL LVL3 (GOWN DISPOSABLE) ×2 IMPLANT
HEMOSTAT POWDER SURGIFOAM 1G (HEMOSTASIS) ×6 IMPLANT
HEMOSTAT SURGICEL 2X14 (HEMOSTASIS) ×2 IMPLANT
INSERT FOGARTY 61MM (MISCELLANEOUS) IMPLANT
INSERT FOGARTY XLG (MISCELLANEOUS) IMPLANT
KIT BASIN OR (CUSTOM PROCEDURE TRAY) ×2 IMPLANT
KIT SUCTION CATH 14FR (SUCTIONS) ×2 IMPLANT
KIT TURNOVER KIT B (KITS) ×2 IMPLANT
KIT VASOVIEW HEMOPRO 2 VH 4000 (KITS) ×2 IMPLANT
KNIFE MICRO-UNI 3.5 30 DEG (BLADE) ×2 IMPLANT
NS IRRIG 1000ML POUR BTL (IV SOLUTION) ×10 IMPLANT
PACK E OPEN HEART (SUTURE) ×2 IMPLANT
PACK OPEN HEART (CUSTOM PROCEDURE TRAY) ×2 IMPLANT
PAD ARMBOARD 7.5X6 YLW CONV (MISCELLANEOUS) ×4 IMPLANT
PAD ELECT DEFIB RADIOL ZOLL (MISCELLANEOUS) ×2 IMPLANT
PENCIL BUTTON HOLSTER BLD 10FT (ELECTRODE) ×2 IMPLANT
POSITIONER HEAD DONUT 9IN (MISCELLANEOUS) ×2 IMPLANT
PUNCH AORTIC ROTATE 4.0MM (MISCELLANEOUS) IMPLANT
PUNCH AORTIC ROTATE 4.5MM 8IN (MISCELLANEOUS) ×2 IMPLANT
PUNCH AORTIC ROTATE 5MM 8IN (MISCELLANEOUS) IMPLANT
SET MPS 3-ND DEL (MISCELLANEOUS) IMPLANT
SPONGE INTESTINAL PEANUT (DISPOSABLE) IMPLANT
SPONGE T-LAP 18X18 ~~LOC~~+RFID (SPONGE) IMPLANT
SPONGE T-LAP 4X18 ~~LOC~~+RFID (SPONGE) IMPLANT
STOPCOCK 4 WAY LG BORE MALE ST (IV SETS) IMPLANT
SUPPORT HEART JANKE-BARRON (MISCELLANEOUS) ×2 IMPLANT
SUT BONE WAX W31G (SUTURE) ×2 IMPLANT
SUT MNCRL AB 4-0 PS2 18 (SUTURE) IMPLANT
SUT PROLENE 3 0 SH DA (SUTURE) IMPLANT
SUT PROLENE 3 0 SH1 36 (SUTURE) ×2 IMPLANT
SUT PROLENE 4 0 SH DA (SUTURE) IMPLANT
SUT PROLENE 4-0 RB1 .5 CRCL 36 (SUTURE) IMPLANT
SUT PROLENE 5 0 C 1 36 (SUTURE) IMPLANT
SUT PROLENE 6 0 C 1 30 (SUTURE) IMPLANT
SUT PROLENE 7 0 BV 1 (SUTURE) IMPLANT
SUT PROLENE 7 0 BV1 MDA (SUTURE) ×2 IMPLANT
SUT PROLENE 8 0 BV175 6 (SUTURE) IMPLANT
SUT SILK 1 MH (SUTURE) IMPLANT
SUT SILK 2 0 SH (SUTURE) IMPLANT
SUT SILK 2 0 SH CR/8 (SUTURE) IMPLANT
SUT STEEL 6MS V (SUTURE) IMPLANT
SUT STEEL STERNAL CCS#1 18IN (SUTURE) IMPLANT
SUT STEEL SZ 6 DBL 3X14 BALL (SUTURE) IMPLANT
SUT VIC AB 1 CTX36XBRD ANBCTR (SUTURE) ×4 IMPLANT
SUT VIC AB 2-0 CT1 TAPERPNT 27 (SUTURE) IMPLANT
SUT VIC AB 2-0 CTX 27 (SUTURE) IMPLANT
SUT VIC AB 3-0 SH 27X BRD (SUTURE) IMPLANT
SUT VIC AB 3-0 X1 27 (SUTURE) IMPLANT
SUT VICRYL 4-0 PS2 18IN ABS (SUTURE) IMPLANT
SYSTEM SAHARA CHEST DRAIN ATS (WOUND CARE) ×2 IMPLANT
TAPE PAPER 2X10 WHT MICROPORE (GAUZE/BANDAGES/DRESSINGS) IMPLANT
TAPE PAPER 3X10 WHT MICROPORE (GAUZE/BANDAGES/DRESSINGS) IMPLANT
TOWEL GREEN STERILE (TOWEL DISPOSABLE) ×2 IMPLANT
TOWEL GREEN STERILE FF (TOWEL DISPOSABLE) ×2 IMPLANT
TRAY FOLEY SLVR 16FR TEMP STAT (SET/KITS/TRAYS/PACK) ×2 IMPLANT
TUBE CONNECTING 20X1/4 (TUBING) IMPLANT
TUBING LAP HI FLOW INSUFFLATIO (TUBING) ×2 IMPLANT
UNDERPAD 30X36 HEAVY ABSORB (UNDERPADS AND DIAPERS) ×2 IMPLANT
WATER STERILE IRR 1000ML POUR (IV SOLUTION) ×4 IMPLANT

## 2023-08-16 NOTE — Progress Notes (Signed)
Report called to CRNA Hailey.

## 2023-08-16 NOTE — Anesthesia Procedure Notes (Signed)
Arterial Line Insertion Start/End12/13/2024 6:55 AM, 08/16/2023 7:00 AM Performed by: Rachel Moulds, CRNA  Preanesthetic checklist: patient identified, IV checked, site marked, risks and benefits discussed, surgical consent, monitors and equipment checked, pre-op evaluation, timeout performed and anesthesia consent Lidocaine 1% used for infiltration and patient sedated Left, radial was placed Catheter size: 20 G Hand hygiene performed  and maximum sterile barriers used  Allen's test indicative of satisfactory collateral circulation Attempts: 1 Procedure performed without using ultrasound guided technique. Following insertion, Biopatch and dressing applied. Post procedure assessment: normal  Patient tolerated the procedure well with no immediate complications.

## 2023-08-16 NOTE — Progress Notes (Signed)
Patient ID: Rickey Lucas, male   DOB: 1956-05-22, 67 y.o.   MRN: [id number]   TCTS Evening Rounds:   Hemodynamically stable  CI = 2.1  Extubated on [place].  Urine output good  CT output low  CBC    Component Value Date/Time   WBC 14.4 (H) 08/16/2023 1244   RBC 3.78 (L) 08/16/2023 1244   HGB 12.4 (L) 08/16/2023 1244   HGB 16.3 07/30/2023 1724   HCT 35.5 (L) 08/16/2023 1244   HCT 49.4 07/30/2023 1724   PLT 184 08/16/2023 1244   PLT 287 07/30/2023 1724   MCV 93.9 08/16/2023 1244   MCV 97 07/30/2023 1724   MCH 32.8 08/16/2023 1244   MCHC 34.9 08/16/2023 1244   RDW 13.3 08/16/2023 1244   RDW 12.3 07/30/2023 1724     BMET    Component Value Date/Time   NA 134 (L) 08/16/2023 1126   NA 136 07/30/2023 1724   K 4.5 08/16/2023 1126   CL 98 08/16/2023 1123   CO2 23 08/16/2023 0323   GLUCOSE 145 (H) 08/16/2023 1123   BUN 11 08/16/2023 1123   BUN 11 07/30/2023 1724   CREATININE 0.70 08/16/2023 1123   CALCIUM 8.5 (L) 08/16/2023 0323   EGFR 96 07/30/2023 1724   GFRNONAA >60 08/16/2023 0323     A/P:  Stable postop course. Continue current plans

## 2023-08-16 NOTE — Brief Op Note (Addendum)
08/13/2023 - 08/16/2023  9:21 AM  PATIENT:  Rickey Lucas  67 y.o. male  PRE-OPERATIVE DIAGNOSIS:  CORONARY ARTERY DISEASE  POST-OPERATIVE DIAGNOSIS:  CORONARY ARTERY DISEASE  PROCEDURE:  Procedure(s): CORONARY ARTERY BYPASS GRAFTING (CABG) (N/A) TRANSESOPHAGEAL ECHOCARDIOGRAM (TEE) (N/A) Vein harvest time: Vein prep time: LIMA-LAD SVG-OM SVG-DIAG  SURGEON:  Surgeons and Role:    * Alleen Borne, MD - Primary  PHYSICIAN ASSISTANT: Jerrico Covello PA-C  ASSISTANTS: Alfonso Patten MD   ANESTHESIA:   general  EBL:  428 mL  COMPLICATIONS: NO KNOWN   BLOOD ADMINISTERED:none  DRAINS:  LEFT PLEURAL AND MEDIASTINAL CHEST TUBES    LOCAL MEDICATIONS USED:  NONE  SPECIMEN:  No Specimen  DISPOSITION OF SPECIMEN:  N/A  COUNTS:  YES  TOURNIQUET:  * No tourniquets in log *  DICTATION: .Dragon Dictation  PLAN OF CARE: Admit to inpatient   PATIENT DISPOSITION:  ICU - intubated and hemodynamically stable.   Delay start of Pharmacological VTE agent (>24hrs) due to surgical blood loss or risk of bleeding: yes  COMPLICATIONS: NO KNOWN

## 2023-08-16 NOTE — Progress Notes (Signed)
Pt transferred to pre-op per OR staff and this RN. Pt's wife took his  belongings home yesterday, all he has in room is his undergarment. Heparin was stopped at 0545 on call to OR. Pt voided prior to leaving for OR. Vitals stable at the time of transfer. CCMD called and notified.

## 2023-08-16 NOTE — Anesthesia Procedure Notes (Signed)
Procedure Name: Intubation Date/Time: 08/16/2023 7:41 AM  Performed by: Rachel Moulds, CRNAPre-anesthesia Checklist: Patient identified, Emergency Drugs available, Suction available, Patient being monitored and Timeout performed Patient Re-evaluated:Patient Re-evaluated prior to induction Oxygen Delivery Method: Circle system utilized Preoxygenation: Pre-oxygenation with 100% oxygen Induction Type: IV induction Ventilation: Oral airway inserted - appropriate to patient size and Mask ventilation with difficulty Laryngoscope Size: Mac and 4 Grade View: Grade II Tube type: Oral Tube size: 8.0 mm Number of attempts: 1 Airway Equipment and Method: Stylet Placement Confirmation: ETT inserted through vocal cords under direct vision, positive ETCO2, CO2 detector and breath sounds checked- equal and bilateral Secured at: 24 cm Tube secured with: Tape Dental Injury: Teeth and Oropharynx as per pre-operative assessment

## 2023-08-16 NOTE — Anesthesia Postprocedure Evaluation (Signed)
Anesthesia Post Note  Patient: Rickey Lucas  Procedure(s) Performed: CORONARY ARTERY BYPASS GRAFTING (CABG) x THREE USING LEFT INTERNAL MAMMARY ARTERY AND RIGHT GREATER SAPHENEOUS VEIN HARVESTED ENDOSCOPICLY (Chest) TRANSESOPHAGEAL ECHOCARDIOGRAM (TEE)     Patient location during evaluation: SICU Anesthesia Type: General Level of consciousness: sedated Pain management: pain level controlled Vital Signs Assessment: post-procedure vital signs reviewed and stable Respiratory status: respiratory function stable, patient remains intubated per anesthesia plan and patient on ventilator - see flowsheet for VS Cardiovascular status: blood pressure returned to baseline and stable Postop Assessment: no apparent nausea or vomiting Anesthetic complications: no   No notable events documented.  Last Vitals:  Vitals:   08/16/23 0723 08/16/23 1234  BP:    Pulse: 63 80  Resp: 13 16  Temp:    SpO2: 95% 98%    Last Pain:  Vitals:   08/16/23 0534  TempSrc: Oral  PainSc:                  Mariann Barter

## 2023-08-16 NOTE — Procedures (Signed)
Extubation Procedure Note  Patient Details:   Name: Rickey Lucas DOB: [birth year] MRN: [id number]   Airway Documentation:    Vent end date: 08/16/23 Vent end time: 1729   Evaluation  O2 sats: stable throughout Complications: No apparent complications Patient did tolerate procedure well. Bilateral Breath Sounds: Clear   Yes Pt extubated per rapid wean protocol to 4L [place]. Pt passed all parameters with a NIF >-40 and VC 0.75L. Pt able to state name and has a good cough.   Kerri Perches 08/16/2023, 5:29 PM

## 2023-08-16 NOTE — Anesthesia Procedure Notes (Addendum)
Central Venous Catheter Insertion Performed by: Mariann Barter, MD, anesthesiologist Start/End12/13/2024 6:30 AM, 08/16/2023 6:45 AM Patient location: Pre-op. Preanesthetic checklist: patient identified, IV checked, site marked, risks and benefits discussed, surgical consent, monitors and equipment checked, pre-op evaluation, timeout performed and anesthesia consent Position: Trendelenburg Lidocaine 1% used for infiltration and patient sedated Hand hygiene performed , maximum sterile barriers used  and Seldinger technique used Catheter size: 8.5 Fr Total catheter length 10. Central line was placed.Sheath introducer Swan type:thermodilution PA Cath depth:50 Procedure performed using ultrasound guided technique. Ultrasound Notes:anatomy identified, needle tip was noted to be adjacent to the nerve/plexus identified, no ultrasound evidence of intravascular and/or intraneural injection and image(s) printed for medical record Attempts: 1 Following insertion, line sutured and dressing applied. Post procedure assessment: blood return through all ports, free fluid flow and no air  Patient tolerated the procedure well with no immediate complications.

## 2023-08-16 NOTE — Op Note (Signed)
CARDIOVASCULAR SURGERY OPERATIVE NOTE  08/16/2023  Surgeon:  Alleen Borne, MD  First Assistant: Victorino Sparrow, MD and Gershon Crane, PA-C:   An experienced assistant was required given the complexity of this surgery and the standard of surgical care. The assistant was needed for endoscopic vein harvest, exposure, dissection, suctioning, retraction of delicate tissues and sutures, instrument exchange and for overall help during this procedure.   Preoperative Diagnosis:  Severe left main and LAD coronary artery disease   Postoperative Diagnosis:  Same   Procedure:  Median Sternotomy Extracorporeal circulation 3.   Coronary artery bypass grafting x 3  Left internal mammary artery graft to the LAD SVG to D1 SVG to OM  4.   Endoscopic vein harvest from the right leg   Anesthesia:  General Endotracheal   Clinical History/Surgical Indication:  This 67 year old gentleman has severe left main and proximal LAD stenosis with crescendo anginal symptoms.  I agree that coronary artery bypass graft surgery is the best treatment for resolution of his symptoms and to prevent myocardial loss and sudden death.  This will require bypass to the LAD, first diagonal, and obtuse marginal branch.  I reviewed the cardiac catheterization films with the patient and answered his questions. I discussed the operative procedure with the patient including alternatives, benefits and risks; including but not limited to bleeding, blood transfusion, infection, stroke, myocardial infarction, graft failure, heart block requiring a permanent pacemaker, organ dysfunction, and death.  [name] understands and agrees to proceed.    Preparation:  The patient was seen in the preoperative holding area and the correct patient, correct operation were confirmed with the patient after reviewing the medical record and  catheterization. The consent was signed by me. Preoperative antibiotics were given. A pulmonary arterial line and radial arterial line were placed by the anesthesia team. The patient was taken back to the operating room and positioned supine on the operating room table. After being placed under general endotracheal anesthesia by the anesthesia team a foley catheter was placed. The neck, chest, abdomen, and both legs were prepped with betadine soap and solution and draped in the usual sterile manner. A surgical time-out was taken and the correct patient and operative procedure were confirmed with the nursing and anesthesia staff.   Cardiopulmonary Bypass:  A median sternotomy was performed. The pericardium was opened in the midline. Right ventricular function appeared normal. The ascending aorta was of normal size and had no palpable plaque. There were no contraindications to aortic cannulation or cross-clamping. The patient was fully systemically heparinized and the ACT was maintained > 400 sec. The proximal aortic arch was cannulated with a 20 F aortic cannula for arterial inflow. Venous cannulation was performed via the right atrial appendage using a two-staged venous cannula. An antegrade cardioplegia/vent cannula was inserted into the mid-ascending aorta. Aortic occlusion was performed with a single cross-clamp. Systemic cooling to 32 degrees Centigrade and topical cooling of the heart with iced saline were used. Hyperkalemic antegrade cold blood cardioplegia was used to induce diastolic arrest and was then given at about 20 minute intervals throughout the period of arrest to maintain myocardial temperature at or below 10 degrees centigrade. A temperature probe was inserted into the interventricular septum and an insulating pad was placed in the pericardium.   Left internal mammary artery harvest:  The left side of the sternum was retracted using the Rultract retractor. The left internal mammary artery  was harvested as a pedicle graft. All side branches were clipped. It  was a medium-sized vessel of good quality with excellent blood flow. It was ligated distally and divided. It was sprayed with topical papaverine solution to prevent vasospasm.   Endoscopic vein harvest: Performed by Gershon Crane, PA-C  The right greater saphenous vein was harvested endoscopically through a 2 cm incision medial to the right knee. It was harvested from the upper thigh to below the knee. It was a medium-sized vein of good quality. The side branches were all ligated with 4-0 silk ties.    Coronary arteries:  The coronary arteries were examined.  LAD:  large vessel with no distal disease. D1 was a large vessel with no distal disease. D2 small LCX:  Large OM with no distal disease. RCA:  minimal segmental plaque    Grafts:  LIMA to the LAD: 2.0 mm. It was sewn end to side using 8-0 prolene continuous suture. SVG to D1:  1.75 mm. It was sewn end to side using 7-0 prolene continuous suture. SVG to OM:  2.0 mm. It was sewn end to side using 7-0 prolene continuous suture.   The proximal vein graft anastomoses were performed to the mid-ascending aorta using continuous 6-0 prolene suture. Graft markers were placed around the proximal anastomoses.   Completion:  The patient was rewarmed to 37 degrees Centigrade. The clamp was removed from the LIMA pedicle and there was rapid warming of the septum and return of ventricular fibrillation. The crossclamp was removed with a time of 70 minutes. There was spontaneous return of sinus rhythm. The distal and proximal anastomoses were checked for hemostasis. The position of the grafts was satisfactory. Two temporary epicardial pacing wires were placed on the right atrium and two on the right ventricle. The patient was weaned from CPB without difficulty on no inotropes. CPB time was 92 minutes. Cardiac output was 5 LPM. TEE showed normal LV systolic function. Heparin was fully  reversed with protamine and the aortic and venous cannulas removed. Hemostasis was achieved. Mediastinal and left pleural drainage tubes were placed. The sternum was closed with  #6 stainless steel wires. The fascia was closed with continuous # 1 vicryl suture. The subcutaneous tissue was closed with 2-0 vicryl continuous suture. The skin was closed with 3-0 vicryl subcuticular suture. All sponge, needle, and instrument counts were reported correct at the end of the case. Dry sterile dressings were placed over the incisions and around the chest tubes which were connected to pleurevac suction. The patient was then transported to the surgical intensive care unit in  stable condition.

## 2023-08-16 NOTE — Transfer of Care (Signed)
Immediate Anesthesia Transfer of Care Note  Patient: Rickey Lucas  Procedure(s) Performed: CORONARY ARTERY BYPASS GRAFTING (CABG) x THREE USING LEFT INTERNAL MAMMARY ARTERY AND RIGHT GREATER SAPHENEOUS VEIN HARVESTED ENDOSCOPICLY (Chest) TRANSESOPHAGEAL ECHOCARDIOGRAM (TEE)  Patient Location: PACU  Anesthesia Type:General  Level of Consciousness: Patient remains intubated per anesthesia plan  Airway & Oxygen Therapy: Patient remains intubated per anesthesia plan and Patient placed on Ventilator (see vital sign flow sheet for setting)  Post-op Assessment: Report given to RN and Post -op Vital signs reviewed and stable  Post vital signs: Reviewed and stable  Last Vitals:  Vitals Value Taken Time  BP 107/58   Temp 36.5 C 08/16/23 1238  Pulse 80 08/16/23 1238  Resp 16 08/16/23 1238  SpO2 97 % 08/16/23 1238  Vitals shown include unfiled device data.  Last Pain:  Vitals:   08/16/23 0534  TempSrc: Oral  PainSc:          Complications: No notable events documented.

## 2023-08-16 NOTE — Plan of Care (Signed)

## 2023-08-16 NOTE — Hospital Course (Signed)
  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  The first diagonal branch had an FFR of 0.77.  The left main was not felt to have any significant stenosis with an FFR of 0.99.  The left circumflex had no significant stenosis with a proximal FFR of 0.88 and a distal FFR 0.85.  The RCA had a borderline significant stenosis with an FFR in the PDA of 0.77.  He subsequently underwent cardiac catheterization showing a 75% distal left main stenosis.  There was a long 70 to 75% severely calcified proximal LAD stenosis.  Left circumflex was a large vessel with no significant disease and a large graftable OM.  The RCA was a large dominant vessel with mild nonobstructive plaque.  LVEDP was normal.  2D echocardiogram showed an ejection fraction of 60 to 65% with grade 1 diastolic dysfunction.  There is no significant valvular disease.   He reports that he walks several miles every day and occasionally has some substernal chest discomfort that resolves with rest.  He also reports having some episodes without activity.  He said that he feels more aware of the discomfort now that he has the diagnosis.  He has had a few episodes of tachycardia with some lightheadedness.  He had an episode of syncope in 2019 that was felt to be most likely vasovagal.   His father suffered a cardiac arrest at age 26 near a fire station and was successfully resuscitated but had a low ejection fraction and ICD and lived another 10 years.  Following full review of the patient and his studies Dr. Laneta Simmers recommended CABG.  He was  scheduled electively but did present to the emergency room with chest pain prior to admission and was stabilized with management of unstable angina prior to proceeding with surgery.  Hospital course:  The patient was medically stabilized and felt to be acceptable to proceed with surgery and on 08/16/2023 he was taken to the operating room and underwent CABG x 3.  A LIMA-LAD graft, SVG-OM, and SVG-diagonal were placed.  He tolerated the procedure well was taken to the surgical intensive care unit in stable condition.

## 2023-08-16 NOTE — Discharge Summary (Incomplete)
[address]       [place] [address]             [phone]    Physician Discharge Summary  Patient ID: Rickey Lucas MRN: [id number] DOB/AGE: 68-14-1957 68 y.o.  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  The first diagonal branch had an FFR of 0.77.  The left main was not felt to have any significant stenosis with an FFR of 0.99.  The left circumflex had no significant stenosis with a proximal FFR of 0.88 and a distal FFR 0.85.  The RCA had a borderline significant stenosis with an FFR in the PDA of 0.77.  He subsequently underwent cardiac catheterization showing a 75% distal left main stenosis.  There was a long 70 to 75% severely calcified proximal LAD stenosis.  Left circumflex was a large vessel with no significant disease and a large graftable OM.  The RCA was a large dominant vessel with mild nonobstructive plaque.  LVEDP was normal.  2D echocardiogram showed an ejection fraction of 60 to 65% with grade 1 diastolic dysfunction.  There is no significant valvular disease.   He reports that he walks several miles every day and occasionally has some substernal chest discomfort  that resolves with rest.  He also reports having some episodes without activity.  He said that he feels more aware of the discomfort now that he has the diagnosis.  He has had a few episodes of tachycardia with some lightheadedness.  He had an episode of syncope in 2019 that was felt to be most likely vasovagal.   His father suffered a cardiac arrest at age 97 near a fire station and was successfully resuscitated but had a low ejection fraction and ICD and lived another 10 years.  Following full review of the patient and his studies Dr. Laneta Simmers recommended CABG.  He was scheduled electively but did present to the emergency room with chest pain prior to admission and was stabilized with management of unstable angina prior to proceeding with surgery.  Hospital course:  The patient was medically stabilized and felt to be acceptable to proceed with surgery and on 08/16/2023 he was taken to the operating room and underwent CABG x 3.  A LIMA-LAD graft, SVG-OM, and SVG-diagonal were placed.  He tolerated the procedure well was taken to the surgical intensive care unit in stable condition.  Vital signs and hemodynamics were satisfactory.  He was weaned from the mechanical ventilator and extubated by 5:30 PM on the day of surgery.  The monitoring lines and chest tubes were removed on postop day 1.  Diuresis was begun and he was mobilized.  The  second postop day, he was transition to torsemide for further diuresis.  He was not started on a beta-blocker early postoperatively due to marginal blood pressures.  Support with midodrine was continued after transfer to 4E progressive care.  The patient remains in NSR.  His pacing wires were removed without difficulty.  He had issues with constipation and was treated with prunes and miralax.  This did not resolve and he was treated with suppository.  He developed dizziness, presyncope with ambulation.  His Demadex was discontinued.  He is ambulating without difficulty.  His  surgical incisions are free from infection.  He is medically stable for discharge home today.  Consults: None  Significant Diagnostic Studies: angiography:     Mid LM to Dist LM lesion is 75% stenosed.   Prox LAD to Mid LAD lesion is 70% stenosed.   Prox RCA lesion is 30% stenosed.   RPAV lesion is 50% stenosed.   Severe non-calcified distal left main stenosis of 75% Severe calcified proximal LAD stenosis of 70-75% Patent LCx with large, graftable OM Large, dominant RCA with mild nonobstructive plaquing Normal LVEDP   Recommendations: Outpatient cardiac surgical evaluation for CABG. Advised to avoid strenuous activity. Otherwise, continue current medical therapy.  Treatments: surgery:   08/16/2023   Surgeon:  Alleen Borne, MD   First Assistant: Victorino Sparrow, MD and Gershon Crane, PA-C:   An experienced assistant was required given the complexity of this surgery and the standard of surgical care. The assistant was needed for endoscopic vein harvest, exposure, dissection, suctioning, retraction of delicate tissues and sutures, instrument exchange and for overall help during this procedure.     Preoperative Diagnosis:  Severe left main and LAD coronary artery disease     Postoperative Diagnosis:  Same     Procedure:   Median Sternotomy Extracorporeal circulation 3.   Coronary artery bypass grafting x 3   Left internal mammary artery graft to the LAD SVG to D1 SVG to OM   4.   Endoscopic vein harvest from the right leg   Discharge Exam: Blood pressure 131/77, pulse 88, temperature 98.7 F (37.1 C), temperature source Oral, resp. rate 18, height 5\' 11"  (1.803 m), weight 82.8 kg, SpO2 91%.  General appearance: alert, cooperative, and no distress Heart: regular rate and rhythm Lungs: clear to auscultation bilaterally Abdomen: soft, non-tender; bowel sounds normal; no masses,  no organomegaly Extremities: edema trace Wound: clean and dry  Discharge  Medications:  The patient has been discharged on:   1.Beta Blocker:  Yes [   ]                              No   [ x  ]                              If No, reason: hypotension  2.Ace Inhibitor/ARB: Yes [   ]                                     No  [  x  ]                                     If No, reason: hypotension  3.Statin:  Yes [  X ]                  No  [   ]                  If No, reason:  4.Ecasa:  Yes  [  X ]                  No   [   ]                  If No, reason:  Patient had ACS upon admission: No  Plavix/P2Y12 inhibitor: Yes [   ]                                      No  [x   ]     Discharge Instructions     Amb Referral to Cardiac Rehabilitation   Complete by: As directed    Diagnosis: CABG   CABG X ___: 3   After initial evaluation and assessments completed: Virtual Based Care may be provided alone or in conjunction with Phase 2 Cardiac Rehab based on patient barriers.: Yes   Intensive Cardiac Rehabilitation (ICR) [facility] location only OR Traditional Cardiac Rehabilitation (TCR) *If criteria for ICR are not met will enroll in TCR [facility] only): Yes      Allergies as of 08/21/2023   No Known Allergies      Medication List     STOP taking these medications    metoprolol succinate 50 MG 24 hr tablet Commonly known as: Toprol XL       TAKE these medications    acetaminophen 500 MG tablet Commonly known as: TYLENOL Take 500 mg by mouth every 6 (six) hours as needed for mild pain (pain score 1-3) or moderate pain (pain score 4-6).   aspirin EC 81 MG tablet Take 1 tablet (81 mg total) by mouth daily. Swallow whole.   Coenzyme Q10 100 MG capsule Take 100 mg by mouth 2 (two) times daily.   furosemide 40 MG tablet Commonly known as: Lasix Take 1 tablet (40 mg total) by mouth daily as needed. For weight gain of 3-5 lbs in 24-48 hours   midodrine 5 MG tablet Commonly known as: PROAMATINE Take 1 tablet (5 mg total) by mouth 2 (two) times  daily with a meal.   multivitamin tablet Take 1 tablet by mouth daily.   potassium chloride SA 20 MEQ tablet Commonly known as: KLOR-CON M Take 1 tablet (20 mEq total) by mouth daily as needed. Take only on days you take Lasix   ProAir HFA 108 (90 Base) MCG/ACT inhaler Generic drug: albuterol Inhale 2 puffs into the lungs every 6 (six) hours as needed for wheezing or shortness of breath.   rosuvastatin 20 MG tablet Commonly known as: CRESTOR Take 1 tablet (20 mg total) by mouth daily. What changed:  medication strength how much to take   traMADol 50 MG tablet Commonly known as: ULTRAM Take 1 tablet (50 mg total) by mouth every 4 (four) hours as needed for moderate pain (pain score 4-6).   TURMERIC PO Take 500 mg by mouth daily at 12 noon.   vitamin C 1000 MG tablet Take 500 mg by mouth 2 (two) times daily.   Vitamin D-3 125 MCG (5000 UT) Tabs Take 5,000 Units by mouth See admin instructions. Twice  a week- Sunday and Thursday   vitamin E 180 MG (400 UNITS) capsule Take 400 Units by mouth 2 (two) times daily.               Durable Medical Equipment  (From admission, onward)           Start     Ordered   08/19/23 0731  For home use only DME 4 wheeled rolling walker with seat  Once       Question Answer Comment  Patient needs a walker to treat with the following condition Physical deconditioning   Patient needs a walker to treat with the following condition S/P CABG (coronary artery bypass graft)      12 /16/24 0730            Follow-up Information     [facility] Follow up on 09/18/2023.   Why: Please get CXR at 12:00 prior to your appointment with Dr. Sharee Pimple office Contact information: [address] [place] [address]        [facility] Follow up on 08/26/2023.   Specialty: Cardiothoracic Surgery Why: Appointment is at 11:00 for suture removal Contact information: [address] [place], [address] [place] [address] [phone]        [facility] [place] Follow up on 09/18/2023.   Specialty: Cardiothoracic Surgery Why: Appointment is at 1:00 Contact information: [address] [place], [address] [place] [address] [phone]        [facility] [place] Follow up on 09/05/2023.   Specialties: Cardiology, Radiology Why: Appointment is at 10:55 Contact information: [address] [place] [address] [phone]         [facility] Follow up.   Why: (Adapt)- rollator arranged- to be delivered to room prior to discharge Contact information: [address] [facility] [place] [address] [phone]                 Signed:  Lowella Dandy, PA-C  08/21/2023, 7:37 AM

## 2023-08-16 NOTE — Interval H&P Note (Signed)
History and Physical Interval Note:  08/16/2023 6:41 AM  Rickey Lucas  has presented today for surgery, with the diagnosis of CAD.  The various methods of treatment have been discussed with the patient and family. After consideration of risks, benefits and other options for treatment, the patient has consented to  Procedure(s): CORONARY ARTERY BYPASS GRAFTING (CABG) (N/A) TRANSESOPHAGEAL ECHOCARDIOGRAM (TEE) (N/A) as a surgical intervention.  The patient's history has been reviewed, patient examined, no change in status, stable for surgery.  I have reviewed the patient's chart and labs.  Questions were answered to the patient's satisfaction.     Alleen Borne

## 2023-08-16 NOTE — Anesthesia Preprocedure Evaluation (Signed)
Anesthesia Evaluation  Patient identified by MRN, date of birth, ID band Patient awake    Reviewed: Allergy & Precautions, NPO status , Patient's Chart, lab work & pertinent test results, reviewed documented beta blocker date and time   History of Anesthesia Complications Negative for: history of anesthetic complications  Airway Mallampati: II  TM Distance: >3 FB     Dental no notable dental hx.    Pulmonary neg shortness of breath, asthma , neg recent URI   breath sounds clear to auscultation       Cardiovascular + angina at rest + CAD  (-) Cardiac Stents (-) dysrhythmias (-) pacemaker(-) Cardiac Defibrillator  Rhythm:Regular Rate:Normal  75% LM dz   Neuro/Psych neg Seizures    GI/Hepatic ,neg GERD  ,,(+) neg Cirrhosis        Endo/Other    Renal/GU Renal disease     Musculoskeletal   Abdominal   Peds  Hematology   Anesthesia Other Findings   Reproductive/Obstetrics                              Anesthesia Physical Anesthesia Plan  ASA: 4  Anesthesia Plan: General   Post-op Pain Management:    Induction: Intravenous  PONV Risk Score and Plan: 2 and Ondansetron and Propofol infusion  Airway Management Planned: Oral ETT  Additional Equipment: Arterial line, CVP, PA Cath, TEE and Ultrasound Guidance Line Placement  Intra-op Plan:   Post-operative Plan: Post-operative intubation/ventilation  Informed Consent: I have reviewed the patients History and Physical, chart, labs and discussed the procedure including the risks, benefits and alternatives for the proposed anesthesia with the patient or authorized representative who has indicated his/her understanding and acceptance.     Dental advisory given  Plan Discussed with: CRNA  Anesthesia Plan Comments:          Anesthesia Quick Evaluation

## 2023-08-17 ENCOUNTER — Inpatient Hospital Stay (HOSPITAL_COMMUNITY): Payer: Medicare Other

## 2023-08-17 LAB — BASIC METABOLIC PANEL
Anion gap: 3 — ABNORMAL LOW (ref 5–15)
Anion gap: 8 (ref 5–15)
BUN: 9 mg/dL (ref 8–23)
BUN: 10 mg/dL (ref 8–23)
CO2: 23 mmol/L (ref 22–32)
CO2: 26 mmol/L (ref 22–32)
Calcium: 7.1 mg/dL — ABNORMAL LOW (ref 8.9–10.3)
Calcium: 7.7 mg/dL — ABNORMAL LOW (ref 8.9–10.3)
Chloride: 108 mmol/L (ref 98–111)
Chloride: 99 mmol/L (ref 98–111)
Creatinine, Ser: 0.77 mg/dL (ref 0.61–1.24)
Creatinine, Ser: 1.18 mg/dL (ref 0.61–1.24)
GFR, Estimated: >60 mL/min (ref >60)
GFR, Estimated: >60 mL/min (ref >60)
Glucose, Bld: 104 mg/dL — ABNORMAL HIGH (ref 70–99)
Glucose, Bld: 202 mg/dL — ABNORMAL HIGH (ref 70–99)
Potassium: 4.2 mmol/L (ref 3.5–5.1)
Potassium: 3.8 mmol/L (ref 3.5–5.1)
Sodium: 134 mmol/L — ABNORMAL LOW (ref 135–145)
Sodium: 133 mmol/L — ABNORMAL LOW (ref 135–145)

## 2023-08-17 LAB — GLUCOSE, CAPILLARY
Glucose-Capillary: 110 mg/dL — ABNORMAL HIGH (ref 70–99)
Glucose-Capillary: 97 mg/dL (ref 70–99)
Glucose-Capillary: 107 mg/dL — ABNORMAL HIGH (ref 70–99)
Glucose-Capillary: 123 mg/dL — ABNORMAL HIGH (ref 70–99)
Glucose-Capillary: 173 mg/dL — ABNORMAL HIGH (ref 70–99)
Glucose-Capillary: 134 mg/dL — ABNORMAL HIGH (ref 70–99)

## 2023-08-17 LAB — COMPREHENSIVE METABOLIC PANEL
ALT: 24 U/L (ref 0–44)
AST: 39 U/L (ref 15–41)
Albumin: 3.5 g/dL (ref 3.5–5.0)
Alkaline Phosphatase: 42 U/L (ref 38–126)
Anion gap: 7 (ref 5–15)
BUN: 11 mg/dL (ref 8–23)
CO2: 22 mmol/L (ref 22–32)
Calcium: 7.3 mg/dL — ABNORMAL LOW (ref 8.9–10.3)
Chloride: 106 mmol/L (ref 98–111)
Creatinine, Ser: 0.89 mg/dL (ref 0.61–1.24)
GFR, Estimated: >60 mL/min (ref >60)
Glucose, Bld: 156 mg/dL — ABNORMAL HIGH (ref 70–99)
Potassium: 4.3 mmol/L (ref 3.5–5.1)
Sodium: 135 mmol/L (ref 135–145)
Total Bilirubin: 1.1 mg/dL (ref <1.2)
Total Protein: 5.3 g/dL — ABNORMAL LOW (ref 6.5–8.1)

## 2023-08-17 LAB — CBC WITH DIFFERENTIAL/PLATELET
Abs Immature Granulocytes: 0.05 10*3/uL (ref 0.00–0.07)
Basophils Absolute: 0 10*3/uL (ref 0.0–0.1)
Basophils Relative: 0 %
Eosinophils Absolute: 0 10*3/uL (ref 0.0–0.5)
Eosinophils Relative: 0 %
HCT: 32.3 % — ABNORMAL LOW (ref 39.0–52.0)
Hemoglobin: 10.9 g/dL — ABNORMAL LOW (ref 13.0–17.0)
Immature Granulocytes: 0 %
Lymphocytes Relative: 8 %
Lymphs Abs: 1.2 10*3/uL (ref 0.7–4.0)
MCH: 32.4 pg (ref 26.0–34.0)
MCHC: 33.7 g/dL (ref 30.0–36.0)
MCV: 96.1 fL (ref 80.0–100.0)
Monocytes Absolute: 1.5 10*3/uL — ABNORMAL HIGH (ref 0.1–1.0)
Monocytes Relative: 10 %
Neutro Abs: 12.1 10*3/uL — ABNORMAL HIGH (ref 1.7–7.7)
Neutrophils Relative %: 82 %
Platelets: 180 10*3/uL (ref 150–400)
RBC: 3.36 MIL/uL — ABNORMAL LOW (ref 4.22–5.81)
RDW: 13.6 % (ref 11.5–15.5)
WBC: 14.9 10*3/uL — ABNORMAL HIGH (ref 4.0–10.5)
nRBC: 0 % (ref 0.0–0.2)

## 2023-08-17 LAB — CREATININE, SERUM
Creatinine, Ser: 0.79 mg/dL (ref 0.61–1.24)
GFR, Estimated: >60 mL/min (ref >60)

## 2023-08-17 LAB — CBC
HCT: 32.5 % — ABNORMAL LOW (ref 39.0–52.0)
HCT: 31.8 % — ABNORMAL LOW (ref 39.0–52.0)
HCT: 34.2 % — ABNORMAL LOW (ref 39.0–52.0)
Hemoglobin: 11 g/dL — ABNORMAL LOW (ref 13.0–17.0)
Hemoglobin: 10.6 g/dL — ABNORMAL LOW (ref 13.0–17.0)
Hemoglobin: 11.3 g/dL — ABNORMAL LOW (ref 13.0–17.0)
MCH: 32.7 pg (ref 26.0–34.0)
MCH: 32.3 pg (ref 26.0–34.0)
MCH: 32.4 pg (ref 26.0–34.0)
MCHC: 33.8 g/dL (ref 30.0–36.0)
MCHC: 33.3 g/dL (ref 30.0–36.0)
MCHC: 33 g/dL (ref 30.0–36.0)
MCV: 96.7 fL (ref 80.0–100.0)
MCV: 97 fL (ref 80.0–100.0)
MCV: 98 fL (ref 80.0–100.0)
Platelets: 176 10*3/uL (ref 150–400)
Platelets: 159 10*3/uL (ref 150–400)
Platelets: 166 10*3/uL (ref 150–400)
RBC: 3.36 MIL/uL — ABNORMAL LOW (ref 4.22–5.81)
RBC: 3.28 MIL/uL — ABNORMAL LOW (ref 4.22–5.81)
RBC: 3.49 MIL/uL — ABNORMAL LOW (ref 4.22–5.81)
RDW: 13.7 % (ref 11.5–15.5)
RDW: 13.7 % (ref 11.5–15.5)
RDW: 13.6 % (ref 11.5–15.5)
WBC: 12.5 10*3/uL — ABNORMAL HIGH (ref 4.0–10.5)
WBC: 12.4 10*3/uL — ABNORMAL HIGH (ref 4.0–10.5)
WBC: 11.6 10*3/uL — ABNORMAL HIGH (ref 4.0–10.5)
nRBC: 0 % (ref 0.0–0.2)
nRBC: 0 % (ref 0.0–0.2)
nRBC: 0 % (ref 0.0–0.2)

## 2023-08-17 LAB — MAGNESIUM
Magnesium: 2 mg/dL (ref 1.7–2.4)
Magnesium: 2.5 mg/dL — ABNORMAL HIGH (ref 1.7–2.4)
Magnesium: 2.5 mg/dL — ABNORMAL HIGH (ref 1.7–2.4)

## 2023-08-17 MED ORDER — ENOXAPARIN SODIUM 40 MG/0.4ML IJ SOSY
40.0000 mg | PREFILLED_SYRINGE | Freq: Every day | INTRAMUSCULAR | Status: DC
  Administered 2023-08-17 – 2023-08-20 (×4): 40 mg via SUBCUTANEOUS
  Filled 2023-08-17 (×4): qty 0.4

## 2023-08-17 MED ORDER — FUROSEMIDE 10 MG/ML IJ SOLN
40.0000 mg | Freq: Two times a day (BID) | INTRAMUSCULAR | Status: AC
  Administered 2023-08-17 (×2): 40 mg via INTRAVENOUS
  Filled 2023-08-17 (×2): qty 4

## 2023-08-17 MED ORDER — POTASSIUM CHLORIDE CRYS ER 20 MEQ PO TBCR
20.0000 meq | EXTENDED_RELEASE_TABLET | Freq: Once | ORAL | Status: AC
  Administered 2023-08-17: 20 meq via ORAL
  Filled 2023-08-17: qty 1

## 2023-08-17 MED ORDER — POTASSIUM CHLORIDE CRYS ER 20 MEQ PO TBCR
20.0000 meq | EXTENDED_RELEASE_TABLET | Freq: Two times a day (BID) | ORAL | Status: AC
  Administered 2023-08-17: 20 meq via ORAL
  Filled 2023-08-17: qty 1

## 2023-08-17 MED ORDER — MIDODRINE HCL 5 MG PO TABS
5.0000 mg | ORAL_TABLET | Freq: Three times a day (TID) | ORAL | Status: DC
  Administered 2023-08-17 – 2023-08-21 (×10): 5 mg via ORAL
  Filled 2023-08-17 (×13): qty 1

## 2023-08-17 MED ORDER — ORAL CARE MOUTH RINSE
15.0000 mL | OROMUCOSAL | Status: DC | PRN
Start: 2023-08-17 — End: 2023-08-21

## 2023-08-17 NOTE — Plan of Care (Signed)

## 2023-08-17 NOTE — Progress Notes (Signed)
1 Day Post-Op Procedure(s) (LRB): CORONARY ARTERY BYPASS GRAFTING (CABG) x THREE USING LEFT INTERNAL MAMMARY ARTERY AND RIGHT GREATER SAPHENEOUS VEIN HARVESTED ENDOSCOPICLY (N/A) TRANSESOPHAGEAL ECHOCARDIOGRAM (TEE) (N/A) Subjective: No complaints. Slept well.  Objective: Vital signs in last 24 hours: Temp:  [97.5 F (36.4 C)-101.8 F (38.8 C)] 98.8 F (37.1 C) (12/14 0800) Pulse Rate:  [65-90] 80 (12/14 0800) Cardiac Rhythm: Atrial paced (12/14 0800) Resp:  [12-38] 20 (12/14 0800) BP: (75-127)/(56-83) 104/65 (12/14 0800) SpO2:  [84 %-100 %] 94 % (12/14 0800) Arterial Line BP: (74-132)/(32-86) 97/52 (12/14 0800) FiO2 (%):  [40 %-50 %] 40 % (12/13 1630) Weight:  [93.9 kg] 93.9 kg (12/14 0500)  Hemodynamic parameters for last 24 hours: PAP: (16-43)/(0-22) 37/14 CVP:  [8 mmHg-26 mmHg] 8 mmHg CO:  [3.5 L/min-7.2 L/min] 5.6 L/min CI:  [1.74 L/min/m2-3.6 L/min/m2] 2.8 L/min/m2  Intake/Output from previous day: 12/13 0701 - 12/14 0700 In: 7372.2 [P.O.:300; I.V.:3984.3; Blood:238; IV Piggyback:2849.8] Out: 3713 [Urine:2795; Blood:428; Chest Tube:490] Intake/Output this shift: No intake/output data recorded.  General appearance: alert and cooperative Neurologic: intact Heart: regular rate and rhythm Lungs: clear to auscultation bilaterally Extremities: edema mild Wound: dressings dry  Lab Results: Recent Labs    08/17/23 0012 08/17/23 0455  WBC 14.9* 12.5*  HGB 10.9* 11.0*  HCT 32.3* 32.5*  PLT 180 176   BMET:  Recent Labs    08/17/23 0012 08/17/23 0455  NA 135 134*  K 4.3 4.2  CL 106 108  CO2 22 23  GLUCOSE 156* 104*  BUN 11 9  CREATININE 0.89 0.77  CALCIUM 7.3* 7.1*    PT/INR:  Recent Labs    08/16/23 1244  LABPROT 16.9*  INR 1.4*   ABG    Component Value Date/Time   PHART 7.307 (L) 08/16/2023 1836   HCO3 23.7 08/16/2023 1836   TCO2 25 08/16/2023 1836   ACIDBASEDEF 2.0 08/16/2023 1836   O2SAT 95 08/16/2023 1836   CBG (last 3)  Recent Labs     08/16/23 2311 08/17/23 0459 08/17/23 0735  GLUCAP 70 110* 97   CXR: mild atelectasis  ECG: sinus rhythm, normal  Assessment/Plan: S/P Procedure(s) (LRB): CORONARY ARTERY BYPASS GRAFTING (CABG) x THREE USING LEFT INTERNAL MAMMARY ARTERY AND RIGHT GREATER SAPHENEOUS VEIN HARVESTED ENDOSCOPICLY (N/A) TRANSESOPHAGEAL ECHOCARDIOGRAM (TEE) (N/A)  POD 1 Hemodynamically stable on neo. Wean as tolerated. Will start Midodrine 5 tid today since he required high dose neo overnight. Hold on beta blocker until BP stable off vasopressors.  Recorded wt is up 30 lbs from preop but I doubt that is accurate. Start diuresis.  No hx of DM and glucose under good control. DC SSI.  DC swan, arterial line and chest tubes.  IS, OOB, mobilize.   LOS: 4 days    Alleen Borne 08/17/2023

## 2023-08-17 NOTE — Plan of Care (Signed)
  Problem: Activity: Goal: Ability to tolerate increased activity will improve 08/17/2023 1905 by Evaristo Bury, RN Outcome: Progressing 08/17/2023 1903 by Evaristo Bury, RN Outcome: Progressing 08/17/2023 1903 by Evaristo Bury, RN Outcome: Progressing   Problem: Cardiac: Goal: Ability to achieve and maintain adequate cardiovascular perfusion will improve 08/17/2023 1905 by Evaristo Bury, RN Outcome: Progressing 08/17/2023 1903 by Evaristo Bury, RN Outcome: Progressing 08/17/2023 1903 by Evaristo Bury, RN Outcome: Progressing   Problem: Clinical Measurements: Goal: Ability to maintain clinical measurements within normal limits will improve 08/17/2023 1905 by Evaristo Bury, RN Outcome: Progressing 08/17/2023 1903 by Evaristo Bury, RN Outcome: Progressing 08/17/2023 1903 by Evaristo Bury, RN Outcome: Progressing Goal: Will remain free from infection 08/17/2023 1905 by Evaristo Bury, RN Outcome: Progressing 08/17/2023 1903 by Evaristo Bury, RN Outcome: Progressing 08/17/2023 1903 by Evaristo Bury, RN Outcome: Progressing Goal: Diagnostic test results will improve 08/17/2023 1905 by Evaristo Bury, RN Outcome: Progressing 08/17/2023 1903 by Evaristo Bury, RN Outcome: Progressing 08/17/2023 1903 by Evaristo Bury, RN Outcome: Progressing Goal: Respiratory complications will improve 08/17/2023 1905 by Evaristo Bury, RN Outcome: Progressing 08/17/2023 1903 by Evaristo Bury, RN Outcome: Progressing 08/17/2023 1903 by Evaristo Bury, RN Outcome: Progressing Goal: Cardiovascular complication will be avoided 08/17/2023 1905 by Evaristo Bury, RN Outcome: Progressing 08/17/2023 1903 by Evaristo Bury, RN Outcome: Progressing 08/17/2023 1903 by Evaristo Bury, RN Outcome: Progressing

## 2023-08-17 NOTE — Plan of Care (Signed)
  Problem: Cardiac: Goal: Ability to achieve and maintain adequate cardiovascular perfusion will improve 08/17/2023 1903 by Evaristo Bury, RN Outcome: Progressing 08/17/2023 1903 by Evaristo Bury, RN Outcome: Progressing   Problem: Clinical Measurements: Goal: Ability to maintain clinical measurements within normal limits will improve 08/17/2023 1903 by Evaristo Bury, RN Outcome: Progressing 08/17/2023 1903 by Evaristo Bury, RN Outcome: Progressing Goal: Will remain free from infection 08/17/2023 1903 by Evaristo Bury, RN Outcome: Progressing 08/17/2023 1903 by Evaristo Bury, RN Outcome: Progressing Goal: Diagnostic test results will improve 08/17/2023 1903 by Evaristo Bury, RN Outcome: Progressing 08/17/2023 1903 by Evaristo Bury, RN Outcome: Progressing Goal: Respiratory complications will improve 08/17/2023 1903 by Evaristo Bury, RN Outcome: Progressing 08/17/2023 1903 by Evaristo Bury, RN Outcome: Progressing Goal: Cardiovascular complication will be avoided 08/17/2023 1903 by Evaristo Bury, RN Outcome: Progressing 08/17/2023 1903 by Evaristo Bury, RN Outcome: Progressing

## 2023-08-17 NOTE — Plan of Care (Signed)
  Problem: Cardiac: Goal: Ability to achieve and maintain adequate cardiovascular perfusion will improve Outcome: Progressing   Problem: Clinical Measurements: Goal: Ability to maintain clinical measurements within normal limits will improve Outcome: Progressing Goal: Will remain free from infection Outcome: Progressing Goal: Diagnostic test results will improve Outcome: Progressing Goal: Respiratory complications will improve Outcome: Progressing Goal: Cardiovascular complication will be avoided Outcome: Progressing

## 2023-08-17 NOTE — Progress Notes (Signed)
Patient ID: Rickey Lucas, male   DOB: 05-07-1956, 67 y.o.   MRN: [id number]  TCTS Evening Rounds:  Hemodynamically stable off neo.   Excellent diuresis today. Will give additional KCL.  Chest tubes out.

## 2023-08-18 ENCOUNTER — Inpatient Hospital Stay (HOSPITAL_COMMUNITY): Payer: Medicare Other

## 2023-08-18 ENCOUNTER — Encounter (HOSPITAL_COMMUNITY): Payer: Self-pay | Admitting: Surgery

## 2023-08-18 LAB — BASIC METABOLIC PANEL
Anion gap: 10 (ref 5–15)
BUN: 11 mg/dL (ref 8–23)
CO2: 25 mmol/L (ref 22–32)
Calcium: 7.9 mg/dL — ABNORMAL LOW (ref 8.9–10.3)
Chloride: 98 mmol/L (ref 98–111)
Creatinine, Ser: 1 mg/dL (ref 0.61–1.24)
GFR, Estimated: >60 mL/min (ref >60)
Glucose, Bld: 116 mg/dL — ABNORMAL HIGH (ref 70–99)
Potassium: 4.1 mmol/L (ref 3.5–5.1)
Sodium: 133 mmol/L — ABNORMAL LOW (ref 135–145)

## 2023-08-18 LAB — CBC
HCT: 31.3 % — ABNORMAL LOW (ref 39.0–52.0)
Hemoglobin: 10.3 g/dL — ABNORMAL LOW (ref 13.0–17.0)
MCH: 32.4 pg (ref 26.0–34.0)
MCHC: 32.9 g/dL (ref 30.0–36.0)
MCV: 98.4 fL (ref 80.0–100.0)
Platelets: 140 10*3/uL — ABNORMAL LOW (ref 150–400)
RBC: 3.18 MIL/uL — ABNORMAL LOW (ref 4.22–5.81)
RDW: 13.4 % (ref 11.5–15.5)
WBC: 10.9 10*3/uL — ABNORMAL HIGH (ref 4.0–10.5)
nRBC: 0 % (ref 0.0–0.2)

## 2023-08-18 LAB — GLUCOSE, CAPILLARY
Glucose-Capillary: 115 mg/dL — ABNORMAL HIGH (ref 70–99)
Glucose-Capillary: 137 mg/dL — ABNORMAL HIGH (ref 70–99)

## 2023-08-18 MED ORDER — DOCUSATE SODIUM 100 MG PO CAPS
200.0000 mg | ORAL_CAPSULE | Freq: Every day | ORAL | Status: DC
  Administered 2023-08-18 – 2023-08-20 (×3): 200 mg via ORAL
  Filled 2023-08-18 (×3): qty 2

## 2023-08-18 MED ORDER — POTASSIUM CHLORIDE CRYS ER 20 MEQ PO TBCR
20.0000 meq | EXTENDED_RELEASE_TABLET | Freq: Three times a day (TID) | ORAL | Status: AC
  Administered 2023-08-18 – 2023-08-19 (×6): 20 meq via ORAL
  Filled 2023-08-18 (×6): qty 1

## 2023-08-18 MED ORDER — TORSEMIDE 20 MG PO TABS
20.0000 mg | ORAL_TABLET | Freq: Every day | ORAL | Status: AC
  Administered 2023-08-18 – 2023-08-19 (×2): 20 mg via ORAL
  Filled 2023-08-18 (×2): qty 1

## 2023-08-18 MED ORDER — SODIUM CHLORIDE 0.9% FLUSH: 3.0000 mL | INTRAVENOUS | Status: DC | PRN

## 2023-08-18 MED ORDER — SODIUM CHLORIDE 0.9 % IV SOLN: 250.0000 mL | INTRAVENOUS | Status: AC | PRN

## 2023-08-18 MED ORDER — ASPIRIN 325 MG PO TBEC
325.0000 mg | DELAYED_RELEASE_TABLET | Freq: Every day | ORAL | Status: DC
  Administered 2023-08-18 – 2023-08-21 (×4): 325 mg via ORAL
  Filled 2023-08-18 (×4): qty 1

## 2023-08-18 MED ORDER — ~~LOC~~ CARDIAC SURGERY, PATIENT & FAMILY EDUCATION: Freq: Once | Status: AC

## 2023-08-18 MED ORDER — LACTULOSE 10 GM/15ML PO SOLN
20.0000 g | Freq: Every day | ORAL | Status: DC | PRN
  Administered 2023-08-19: 20 g via ORAL
  Filled 2023-08-18: qty 30

## 2023-08-18 MED ORDER — SODIUM CHLORIDE 0.9% FLUSH
3.0000 mL | Freq: Two times a day (BID) | INTRAVENOUS | Status: DC
  Administered 2023-08-18 – 2023-08-20 (×5): 3 mL via INTRAVENOUS

## 2023-08-18 NOTE — Plan of Care (Signed)
  Problem: Education: Goal: Understanding of cardiac disease, CV risk reduction, and recovery process will improve Outcome: Progressing   Problem: Activity: Goal: Ability to tolerate increased activity will improve Outcome: Progressing   Problem: Cardiac: Goal: Ability to achieve and maintain adequate cardiovascular perfusion will improve Outcome: Progressing   

## 2023-08-18 NOTE — Progress Notes (Signed)
2 Days Post-Op Procedure(s) (LRB): CORONARY ARTERY BYPASS GRAFTING (CABG) x THREE USING LEFT INTERNAL MAMMARY ARTERY AND RIGHT GREATER SAPHENEOUS VEIN HARVESTED ENDOSCOPICLY (N/A) TRANSESOPHAGEAL ECHOCARDIOGRAM (TEE) (N/A) Subjective: No complaints, slept well, ambulated this am.  Objective: Vital signs in last 24 hours: Temp:  [97.8 F (36.6 C)-98.8 F (37.1 C)] 98.2 F (36.8 C) (12/15 0320) Pulse Rate:  [74-82] 79 (12/15 0600) Cardiac Rhythm: Normal sinus rhythm (12/14 2000) Resp:  [16-27] 17 (12/15 0600) BP: (91-114)/(51-68) 108/64 (12/15 0600) SpO2:  [91 %-99 %] 95 % (12/15 0600) Arterial Line BP: (85-127)/(46-63) 94/49 (12/14 1504) Weight:  [84.6 kg] 84.6 kg (12/15 0600)  Hemodynamic parameters for last 24 hours: PAP: (37)/(14) 37/14 CVP:  [8 mmHg] 8 mmHg  Intake/Output from previous day: 12/14 0701 - 12/15 0700 In: 924.1 [P.O.:360; I.V.:464.1; IV Piggyback:100] Out: 3350 [Urine:3350] Intake/Output this shift: No intake/output data recorded.  General appearance: alert and cooperative Neurologic: intact Heart: regular rate and rhythm Lungs: clear to auscultation bilaterally Extremities: edema mild Wound: incisions ok  Lab Results: Recent Labs    08/17/23 1905 08/18/23 0400  WBC 11.6* 10.9*  HGB 11.3* 10.3*  HCT 34.2* 31.3*  PLT 166 140*   BMET:  Recent Labs    08/17/23 1905 08/18/23 0400  NA 133* 133*  K 3.8 4.1  CL 99 98  CO2 26 25  GLUCOSE 202* 116*  BUN 10 11  CREATININE 1.18 1.00  CALCIUM 7.7* 7.9*    PT/INR:  Recent Labs    08/16/23 1244  LABPROT 16.9*  INR 1.4*   ABG    Component Value Date/Time   PHART 7.307 (L) 08/16/2023 1836   HCO3 23.7 08/16/2023 1836   TCO2 25 08/16/2023 1836   ACIDBASEDEF 2.0 08/16/2023 1836   O2SAT 95 08/16/2023 1836   CBG (last 3)  Recent Labs    08/17/23 1932 08/17/23 2309 08/18/23 0318  GLUCAP 173* 134* 115*   CXR: mild bibasilar atelectasis  Assessment/Plan: S/P Procedure(s)  (LRB): CORONARY ARTERY BYPASS GRAFTING (CABG) x THREE USING LEFT INTERNAL MAMMARY ARTERY AND RIGHT GREATER SAPHENEOUS VEIN HARVESTED ENDOSCOPICLY (N/A) TRANSESOPHAGEAL ECHOCARDIOGRAM (TEE) (N/A)  POD 2 Hemodynamically stable in sinus rhythm. Still on midodrine 5 tid with SBP 90's to low 100's. Hold off on beta blocker for now.  -2400 cc yesterday with diuresis. Wt is 9.5 lbs over preop. Will give Demedex for a couple days.  DC sleeve.   Transfer to 4E and continue IS, ambulation.   LOS: 5 days    Rickey Lucas 08/18/2023

## 2023-08-19 DIAGNOSIS — I200 Unstable angina: Secondary | ICD-10-CM | POA: Diagnosis not present

## 2023-08-19 LAB — ECHO INTRAOPERATIVE TEE
AV Mean grad: 2.3 mm[Hg]
AV Peak grad: 4.4 mm[Hg]
Ao pk vel: 1.05 m/s
Height: 71 in
Weight: 2836 [oz_av]

## 2023-08-19 LAB — GLUCOSE, CAPILLARY: Glucose-Capillary: 127 mg/dL — ABNORMAL HIGH (ref 70–99)

## 2023-08-19 MED ORDER — POLYETHYLENE GLYCOL 3350 17 G PO PACK
17.0000 g | PACK | Freq: Every day | ORAL | Status: DC
Start: 2023-08-19 — End: 2023-08-21
  Administered 2023-08-20: 17 g via ORAL
  Filled 2023-08-19: qty 1

## 2023-08-19 MED FILL — Thrombin (Recombinant) For Soln 20000 Unit: CUTANEOUS | Qty: 1 | Status: AC

## 2023-08-19 NOTE — Discharge Instructions (Signed)
Discharge Instructions:  1. You may shower, please wash incisions daily with soap and water and keep dry.  If you wish to cover wounds with dressing you may do so but please keep clean and change daily.  No tub baths or swimming until incisions have completely healed.  If your incisions become red or develop any drainage please call our office at [phone]  2. No Driving until cleared by Dr. Bartle's office and you are no longer using narcotic pain medications  3. Monitor your weight daily.. Please use the same scale and weigh at same time... If you gain 5-10 lbs in 48 hours with associated lower extremity swelling, please contact our office at [phone]  4. Fever of 101.5 for at least 24 hours with no source, please contact our office at [phone]  5. Activity- up as tolerated, please walk at least 3 times per day.  Avoid strenuous activity, no lifting, pushing, or pulling with your arms over 8-10 lbs for a minimum of 6 weeks  6. If any questions or concerns arise, please do not hesitate to contact our office at [phone]  

## 2023-08-19 NOTE — Evaluation (Signed)
Physical Therapy Evaluation Patient Details Name: Rickey Lucas MRN: [id number] DOB: [birth year] Today's Date: 08/19/2023  History of Present Illness  Pt is 67 yo male who presents on 08/13/23 with weakness in setting of CABG scheduled for 12/13. CABG x3 performed as scheduled. PMH: CAD, hypercholesterolemia   Clinical Impression  Pt admitted with above. Pt walked with cardiac rehab and mobility specialist earlier today with RW and 2LO2 via [place] with contact guard. Pt received sitting in recliner on room air s/p using the bathroom with assist from mobility specialist. This PT began educating pt on sternal precautions (handout given) when pt reports "I feel like I'm going to pass out."  Pt's SpO2 was 95% on RA, LEs were elevated, recliner reclined, pt became diaphoretic with loss of color in face and lips and ultimately passed out for about 15 seconds. 2LO2 placed on patient. Pt's HR dropped to 62bpm, BP 98/65. RN called urgently and came to address situation. Pt then stabilized and HR improved to 87 bpm and SpO2 at 90% and pt assisted back to bed. Suspect pt had a vagal response however I anticipate pt to progress well and be able to d/c home with support of wife and brother in law. Acute PT to cont to follow.      If plan is discharge home, recommend the following: A little help with walking and/or transfers;A little help with bathing/dressing/bathroom;Assist for transportation;Help with stairs or ramp for entrance   Can travel by private vehicle        Equipment Recommendations Rolling walker (2 wheels);BSC/3in1 (shower seat)  Recommendations for Other Services       Functional Status Assessment Patient has had a recent decline in their functional status and demonstrates the ability to make significant improvements in function in a reasonable and predictable amount of time.     Precautions / Restrictions Precautions Precautions: Sternal;Other (comment) Precaution Booklet Issued: Yes  (comment) Precaution Comments: began reviewing and then patient passed out/vagaled Restrictions Weight Bearing Restrictions Per Provider Order: Yes RUE Weight Bearing Per Provider Order: Non weight bearing LUE Weight Bearing Per Provider Order: Non weight bearing      Mobility  Bed Mobility Overal bed mobility: Needs Assistance Bed Mobility: Sit to Sidelying, Rolling Rolling: Contact guard assist       Sit to sidelying: Min assist General bed mobility comments: Pt received sitting up in chair, assisted back to bed, increased time, minA for LE management back into bed, pt held onto heart pillow    Transfers Overall transfer level: Needs assistance Equipment used: None Transfers: Sit to/from Stand, Bed to chair/wheelchair/BSC Sit to Stand: Contact guard assist   Step pivot transfers: Min assist       General transfer comment: pt able to power up from chair while holding onto heart pillow, minA provided to steady pt during step pvt transfer due to having just passed out    Ambulation/Gait               General Gait Details: limited to step pvt to chair as pt passed out in the chair. Pt did amb 175' with RW with mobility specialist Kenan about 10 min prior to PT coming to see patient  Stairs            Wheelchair Mobility     Tilt Bed    Modified Rankin (Stroke Patients Only)       Balance Overall balance assessment: Mild deficits observed, not formally tested  Pertinent Vitals/Pain Pain Assessment Pain Assessment: 0-10 Pain Score: 6  Pain Location: chest Pain Descriptors / Indicators: Sharp Pain Intervention(s):  (RN present to evaluate)    Home Living Family/patient expects to be discharged to:: Private residence Living Arrangements: Spouse/significant other Available Help at Discharge: Family;Available 24 hours/day (brother in law in town to stay with patient while his wife  works) Type of Home: [employer] Access: Stairs to enter Entrance Stairs-Rails: None Secretary/administrator of Steps: 4 Alternate Level Stairs-Number of Steps: flight Home Layout: Two level;Able to live on main level with bedroom/bathroom Home Equipment: Shower seat - built in (pt reports shower seat to be low)      Prior Function Prior Level of Function : Independent/Modified Independent;Working/employed;Driving             Mobility Comments: indep, no AD, is a physician in pain management ADLs Comments: indep     Extremity/Trunk Assessment   Upper Extremity Assessment Upper Extremity Assessment: Overall WFL for tasks assessed    Lower Extremity Assessment Lower Extremity Assessment: Generalized weakness    Cervical / Trunk Assessment Cervical / Trunk Assessment: Other exceptions Cervical / Trunk Exceptions: sternal incision  Communication   Communication Communication: No apparent difficulties  Cognition Arousal: Alert Behavior During Therapy: Anxious (mildly anxious, moves slow) Overall Cognitive Status: Within Functional Limits for tasks assessed                                 General Comments: pt in tune to his body's system, telling this PT he was going to pass out, mildly anxious and moves slowly        General Comments General comments (skin integrity, edema, etc.): pt passed out, SpO2 was 95% on RA, HR dropped from 87 to 62 bpm, became very diaphoretic, was out for about 15 seconds, pt placed on 2Lo2 via [place]    Exercises     Assessment/Plan    PT Assessment Patient needs continued PT services  PT Problem List Decreased strength;Decreased activity tolerance;Decreased balance;Decreased mobility;Cardiopulmonary status limiting activity       PT Treatment Interventions DME instruction;Gait training;Stair training;Functional mobility training;Therapeutic activities;Therapeutic exercise    PT Goals (Current goals can be found in the Care  Plan section)  Acute Rehab PT Goals Patient Stated Goal: feel better PT Goal Formulation: With patient Time For Goal Achievement: 09/02/23 Potential to Achieve Goals: Good    Frequency Min 1X/week     Co-evaluation               AM-PAC PT "6 Clicks" Mobility  Outcome Measure Help needed turning from your back to your side while in a flat bed without using bedrails?: A Little Help needed moving from lying on your back to sitting on the side of a flat bed without using bedrails?: A Little Help needed moving to and from a bed to a chair (including a wheelchair)?: A Little Help needed standing up from a chair using your arms (e.g., wheelchair or bedside chair)?: A Little Help needed to walk in hospital room?: A Little Help needed climbing 3-5 steps with a railing? : A Lot 6 Click Score: 17    End of Session   Activity Tolerance: Other (comment) (limited by passing out) Patient left: in bed;with call bell/phone within reach;with nursing/sitter in room Nurse Communication: Other (comment) (passing out) PT Visit Diagnosis: Unsteadiness on feet (R26.81)    Time: 4098-1191 PT Time Calculation (  min) (ACUTE ONLY): 17 min   Charges:   PT Evaluation $PT Eval Low Complexity: 1 Low   PT General Charges $$ ACUTE PT VISIT: 1 Visit         Lewis Shock, PT, DPT Acute Rehabilitation Services Secure chat preferred Office #: [phone]   Iona Hansen 08/19/2023, 1:41 PM

## 2023-08-19 NOTE — Progress Notes (Signed)
   Patient Name: Rickey Lucas Date of Encounter: 08/19/2023 [facility] Cardiologist: Thurmon Fair, MD   Interval Summary  .    POD 3 CABG  Had an episode of presyncope with nausea earlier today; now feels well  Vital Signs .    Vitals:   08/19/23 1200 08/19/23 1236 08/19/23 1237 08/19/23 1245  BP: 117/71  98/65 102/67  Pulse: 88 77 85 87  Resp: 16 (!) 21 20 16   Temp:   98.7 F (37.1 C)   TempSrc:   Oral   SpO2: 99% 98% 100% 90%  Weight:      Height:        Intake/Output Summary (Last 24 hours) at 08/19/2023 1332 Last data filed at 08/19/2023 1200 Gross per 24 hour  Intake --  Output 2700 ml  Net -2700 ml      08/19/2023    6:12 AM 08/18/2023    6:00 AM 08/17/2023    5:00 AM  Last 3 Weights  Weight (lbs) 187 lb 9.6 oz 186 lb 8.2 oz 207 lb 0.2 oz  Weight (kg) 85.095 kg 84.6 kg 93.9 kg      Telemetry/ECG    Sinus Rhythm - Personally Reviewed  Physical Exam .   GEN: No acute distress.   Neck: No JVD Cardiac: RRR, no murmurs, rubs, or gallops.  Respiratory: Clear to auscultation bilaterally. GI: Soft, nontender, non-distended  MS: No edema  Assessment & Plan .     67 y.o. male with a hx of  syncope 2019 felt vasovagal, CAD w/ CABG planned 12/13, HLD, FH CAD and cardiac arrest in his father at age 60, who was seen 08/13/2023 for the evaluation of Unstable angina at the request of Dr Jeraldine Loots.    Unstable Angina CAD s/p CABG Continue ASA, statin, hold BB given need for midodrine  HLD Crestor 20  Presyncope Sounds vasovagal, control nausea  Hypotension On midodrine, hopefully can be weaned off over the next few weeks.     For questions or updates, please contact [facility] Please consult [email or web address] for contact info under        Signed, Orbie Pyo, MD

## 2023-08-19 NOTE — Progress Notes (Signed)
CARDIAC REHAB PHASE I   PRE:  Rate/Rhythm: 81 SR   BP:  Sitting: 131/72      SaO2: 98 RA   MODE:  Ambulation: 420 ft   POST:  Rate/Rhythm: 88 SR   BP:  Sitting: 132/73      SaO2: 97 RA   Pt ambulated in hallway, using front wheel walker. Moving at slow steady pace, tolerated well with no pain, dizziness or SOB. Returned to bed with call bell and bedside table in reach. Will continue to follow.   [id number]  Woodroe Chen, RN BSN 08/19/2023 10:41 AM

## 2023-08-19 NOTE — Progress Notes (Addendum)
      [address]       [place] [address]             [phone]      3 Days Post-Op Procedure(s) (LRB): CORONARY ARTERY BYPASS GRAFTING (CABG) x THREE USING LEFT INTERNAL MAMMARY ARTERY AND RIGHT GREATER SAPHENEOUS VEIN HARVESTED ENDOSCOPICLY (N/A) TRANSESOPHAGEAL ECHOCARDIOGRAM (TEE) (N/A)  Subjective:  Patient is doing well.  He has not yet moved his bowels.  He is passing gas.    Objective: Vital signs in last 24 hours: Temp:  [98.2 F (36.8 C)-98.5 F (36.9 C)] 98.5 F (36.9 C) (12/16 0300) Pulse Rate:  [71-83] 72 (12/16 0500) Cardiac Rhythm: Normal sinus rhythm (12/15 1900) Resp:  [11-20] 15 (12/16 0500) BP: (97-125)/(56-68) 99/62 (12/16 0500) SpO2:  [93 %-96 %] 93 % (12/16 0500) Weight:  [85.1 kg] 85.1 kg (12/16 0612)  Intake/Output from previous day: 12/15 0701 - 12/16 0700 In: 120 [P.O.:120] Out: 1000 [Urine:1000]  General appearance: alert, cooperative, and no distress Heart: regular rate and rhythm Lungs: clear to auscultation bilaterally Abdomen: soft, non-tender; bowel sounds normal; no masses,  no organomegaly Extremities: edema trace Wound: clean and dry  Lab Results: Recent Labs    08/17/23 1905 08/18/23 0400  WBC 11.6* 10.9*  HGB 11.3* 10.3*  HCT 34.2* 31.3*  PLT 166 140*   BMET:  Recent Labs    08/17/23 1905 08/18/23 0400  NA 133* 133*  K 3.8 4.1  CL 99 98  CO2 26 25  GLUCOSE 202* 116*  BUN 10 11  CREATININE 1.18 1.00  CALCIUM 7.7* 7.9*    PT/INR:  Recent Labs    08/16/23 1244  LABPROT 16.9*  INR 1.4*   ABG    Component Value Date/Time   PHART 7.307 (L) 08/16/2023 1836   HCO3 23.7 08/16/2023 1836   TCO2 25 08/16/2023 1836   ACIDBASEDEF 2.0 08/16/2023 1836   O2SAT 95 08/16/2023 1836   CBG (last 3)  Recent Labs    08/17/23 2309 08/18/23 0318 08/18/23 0728  GLUCAP 134* 115* 137*    Assessment/Plan: S/P Procedure(s) (LRB): CORONARY ARTERY BYPASS GRAFTING (CABG) x THREE USING LEFT INTERNAL  MAMMARY ARTERY AND RIGHT GREATER SAPHENEOUS VEIN HARVESTED ENDOSCOPICLY (N/A) TRANSESOPHAGEAL ECHOCARDIOGRAM (TEE) (N/A)  CV- NSR, BP remains low- continue Midodrine TID, no BB due to BP  Pulm- wean oxygen as tolerated, continue IS Renal- creatinine has been stable, K is normal, continue Demadex GI- constipation, patient eating prunes, add Miralax prn.. patient does not want lactulose PT/OT eval- to help with mobility, tips for getting up/down easier, rollator ordered Dispo- patient stable, d/c EPW today, continue Midorine, Demadex.Marland Kitchen if remains stable, for d/c possibly Wednesday   LOS: 6 days    Lowella Dandy, PA-C 08/19/2023   Chart reviewed, patient examined, agree with above.  His weight is still 10 lbs over preop. Continue diuresis.  Weaning oxygen and trying to have BM. Passing a lot of gas. If no result he can have lactulose.

## 2023-08-19 NOTE — Progress Notes (Signed)
Mobility Specialist Progress Note:   08/19/23 1200  Therapy Vitals  Pulse Rate 88  Resp 16  BP 117/71  Oxygen Therapy  SpO2 99 %  Mobility  Activity Ambulated with assistance in hallway  Level of Assistance  (MinG)  Assistive Device Front wheel walker  Distance Ambulated (ft) 175 ft  Activity Response Tolerated well  Mobility Referral Yes  Mobility visit 1 Mobility  Mobility Specialist Start Time (ACUTE ONLY) 1205  Mobility Specialist Stop Time (ACUTE ONLY) 1225  Mobility Specialist Time Calculation (min) (ACUTE ONLY) 20 min   Pt received ambulating in hallway with RN, pt agreeable for me to take over session. Pt able to ambulate short distance in hallway with slow steps and only MinG assist. VSS. Ambulated on RA. Pt c/o chest pain and slight lightheadedness towards EOS, otherwise asx throughout. When returning to room pt requesting to use BR. Void unsuccessful. Pt left in chair with call bell in reach and all needs met.    Leory Plowman  Mobility Specialist Please contact via [employer] office at [phone]

## 2023-08-19 NOTE — Care Management Important Message (Signed)
Important Message  Patient Details  Name: Rickey Lucas MRN: [id number] Date of Birth: Oct 15, 1955   Important Message Given:  Yes - Medicare IM     Renie Ora 08/19/2023, 9:16 AM

## 2023-08-19 NOTE — Progress Notes (Signed)
   08/19/23 1237  Vitals  Temp 98.7 F (37.1 C)  Temp Source Oral  BP 98/65  MAP (mmHg) 76  BP Location Right Arm  BP Method Automatic  Patient Position (if appropriate) Lying  Pulse Rate 85  ECG Heart Rate 86  Resp 20  Oxygen Therapy  SpO2 100 %  O2 Device Nasal Cannula  O2 Flow Rate (L/min) 2 L/min   Patient was walking with nursing and mobility specialist and went to the rest room and then back in to chair. Pt was coughing some per PT staff member.  The PT staff member was going over sternal precautions per their report patient to stated "he felt like he was going to pass out" and then lost consciousness breifly. Patient then became diaphoretic and nursing staff came into room and oxygen was placed at 2L vital signs obtained. And assisted back to bed. Call bell within reach. Bed side RN aware. Dylanie Quesenberry, Randall An RN

## 2023-08-20 MED ORDER — BISACODYL 10 MG RE SUPP
10.0000 mg | Freq: Once | RECTAL | Status: DC
  Filled 2023-08-20: qty 1

## 2023-08-20 NOTE — Evaluation (Signed)
Occupational Therapy Evaluation Patient Details Name: Rickey Lucas MRN: [id number] DOB: 03-05-1956 Today's Date: 08/20/2023   History of Present Illness Pt is 67 yo male who presents on 08/13/23 with weakness in setting of CABG scheduled for 12/13. CABG x3 performed as scheduled. PMH: CAD, hypercholesterolemia   Clinical Impression   PTA, pt lives with spouse, typically completely independent at baseline and works as a Development worker, community. Pt presents now with minor deficits in endurance and dynamic standing balance. Initiated education re: sternal precautions with ADLs, general body mechanics and IADL considerations. Pt able to transfer and mobilize in hallway with RW with no more than CGA. Pt requires up to Min A for ADLs but will have consistent family support at DC to assist as needed. Will follow acutely but anticipate no OT needs at DC. Pt interested in trying Rollator in next session; may benefit from this DME at home if able to safely use.       If plan is discharge home, recommend the following: A little help with bathing/dressing/bathroom;Assistance with cooking/housework;Assist for transportation    Functional Status Assessment  Patient has had a recent decline in their functional status and demonstrates the ability to make significant improvements in function in a reasonable and predictable amount of time.  Equipment Recommendations  BSC/3in1 (for placement over toilet; consider Rollator pending education)    Recommendations for Other Services       Precautions / Restrictions Precautions Precautions: Sternal;Other (comment) Precaution Booklet Issued: Yes (comment) Precaution Comments: recent hx of syncope during admission Restrictions Weight Bearing Restrictions Per Provider Order: No      Mobility Bed Mobility               General bed mobility comments: in recliner on entry    Transfers Overall transfer level: Needs assistance Equipment used: None Transfers: Sit  to/from Stand Sit to Stand: Supervision           General transfer comment: able to stand using heart pillow with good control      Balance Overall balance assessment: Mild deficits observed, not formally tested                                         ADL either performed or assessed with clinical judgement   ADL Overall ADL's : Needs assistance/impaired Eating/Feeding: Independent   Grooming: Supervision/safety;Standing   Upper Body Bathing: Minimal assistance   Lower Body Bathing: Supervison/ safety   Upper Body Dressing : Minimal assistance Upper Body Dressing Details (indicate cue type and reason): discussed techniques for donning t shirt and jacket to maintain sternal precaution Lower Body Dressing: Minimal assistance Lower Body Dressing Details (indicate cue type and reason): able to bring feet to self though RLE sore. discussed easier clothing to manage at home Toilet Transfer: Contact guard assist;Ambulation;Rolling walker (2 wheels)   Toileting- Clothing Manipulation and Hygiene: Supervision/safety;Sit to/from stand;Sitting/lateral lean Toileting - Clothing Manipulation Details (indicate cue type and reason): discussed turning torso to allow for more safe reach for hygiene w/ pt able to demo with tactile cues       General ADL Comments: pt reported desire to walk in hallway- no adverse symptoms noted. discussed safety with DME (considerations for rollator trial), ADL techniques and considerations for IADLs.     Vision Ability to See in Adequate Light: 0 Adequate Patient Visual Report: No change from baseline Vision Assessment?: No apparent  visual deficits     Perception         Praxis         Pertinent Vitals/Pain Pain Assessment Pain Assessment: Faces Faces Pain Scale: Hurts a little bit Pain Location: chest when coughing Pain Descriptors / Indicators: Sore, Grimacing, Guarding Pain Intervention(s): Monitored during session, Other  (comment) (pt using heart pillow)     Extremity/Trunk Assessment Upper Extremity Assessment Upper Extremity Assessment: Overall WFL for tasks assessed;Right hand dominant   Lower Extremity Assessment Lower Extremity Assessment: Defer to PT evaluation   Cervical / Trunk Assessment Cervical / Trunk Assessment: Other exceptions Cervical / Trunk Exceptions: sternal incision   Communication Communication Communication: No apparent difficulties   Cognition Arousal: Alert Behavior During Therapy: WFL for tasks assessed/performed Overall Cognitive Status: Within Functional Limits for tasks assessed                                 General Comments: pleasant, minor memory deficits as pt reported unsure if he had ordered breakfast or not. follows commands appropriately and does show insight into deficits/safety     General Comments  on RA on entry, able to tolerate mobility on RA though appeared fatigue at end of walk. HR 90s with activity, no reported dizziness    Exercises     Shoulder Instructions      Home Living Family/patient expects to be discharged to:: Private residence Living Arrangements: Spouse/significant other Available Help at Discharge: Family;Available 24 hours/day Type of Home: House Home Access: Stairs to enter [employer] of Steps: 4 Entrance Stairs-Rails: None Home Layout: Two level;Able to live on main level with bedroom/bathroom Alternate Level Stairs-Number of Steps: flight Alternate Level Stairs-Rails: Right;Left Bathroom Shower/Tub: Producer, television/film/video: Standard     Home Equipment: Shower seat - built in;Hand held shower head   Additional Comments: brother in law staying with pt while wife at work. 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        OT Problem List: Decreased activity tolerance;Impaired balance (sitting and/or standing);Decreased knowledge of use of DME or AE;Decreased knowledge of precautions      OT Treatment/Interventions: Self-care/ADL training;Therapeutic exercise;Energy conservation;DME and/or AE instruction;Therapeutic activities;Patient/family education    OT Goals(Current goals can be found in the care plan section) Acute Rehab OT Goals Patient Stated Goal: go home OT Goal Formulation: With patient Time For Goal Achievement: 09/03/23 Potential to Achieve Goals: Good  OT Frequency: Min 1X/week    Co-evaluation              AM-PAC OT "6 Clicks" Daily Activity     Outcome Measure Help from another person eating meals?: None Help from another person taking care of personal grooming?: A Little Help from another person toileting, which includes using toliet, bedpan, or urinal?: A Little Help from another person bathing (including washing, rinsing, drying)?: A Little Help from another person to put on and taking off regular upper body clothing?: A Little Help from another person to put on and taking off regular lower body clothing?: A Little 6 Click Score: 19   End of Session Equipment Utilized During Treatment: Rolling walker (2 wheels) Nurse Communication:  Mobility status  Activity Tolerance: Patient tolerated treatment well Patient left: in chair;with call bell/phone within reach  OT Visit Diagnosis: Other abnormalities of gait and mobility (R26.89)                Time: 4098-1191 OT Time Calculation (min): 29 min Charges:  OT General Charges $OT Visit: 1 Visit OT Evaluation $OT Eval Low Complexity: 1 Low OT Treatments $Self Care/Home Management : 8-22 mins  Bradd Canary, OTR/L Acute Rehab Services Office: [phone]   Lorre Munroe 08/20/2023, 8:15 AM

## 2023-08-20 NOTE — TOC Initial Note (Signed)
Transition of Care (TOC) - Initial/Assessment Note  Donn Pierini RN, BSN Transitions of Care Unit 4E- RN Case Manager See Treatment Team for direct phone #   Patient Details  Name: Rickey Lucas MRN: [id number] Date of Birth: [birth year]  Transition of Care [facility]) CM/SW Contact:    Darrold Span, RN Phone Number: 08/20/2023, 1:52 PM  Clinical Narrative:                 Cm spoke with pt at bedside, discussed DME needs- confirmed pt needs rollator for home. Order has been placed.  Choice offered for DME provider- pt voiced he does not have a preference.   Family will provide transportation home.   Call made to [employer] for DME need- rollator to be delivered to room prior to discharge.   No further TOC needs noted.   Expected Discharge Plan: Home/Self Care Barriers to Discharge: No Barriers Identified   Patient Goals and CMS Choice Patient states their goals for this hospitalization and ongoing recovery are:: return home   Choice offered to / list presented to : Patient      Expected Discharge Plan and Services   Discharge Planning Services: CM Consult Post Acute Care Choice: Durable Medical Equipment Living arrangements for the past 2 months: Single Family Home                 DME Arranged: Walker rolling with seat DME Agency: [employer] Date DME Agency Contacted: 08/20/23 Time DME Agency Contacted: 1351 Representative spoke with at DME Agency: Mitch HH Arranged: NA HH Agency: NA        Prior Living Arrangements/Services Living arrangements for the past 2 months: Single Family Home Lives with:: Spouse Patient language and need for interpreter reviewed:: Yes Do you feel safe going back to the place where you live?: Yes      Need for Family Participation in Patient Care: Yes (Comment) Care giver support system in place?: Yes (comment)   Criminal Activity/Legal Involvement Pertinent to Current Situation/Hospitalization: No - Comment as  needed  Activities of Daily Living   ADL Screening (condition at time of admission) Independently performs ADLs?: Yes (appropriate for developmental age) Is the patient deaf or have difficulty hearing?: No Does the patient have difficulty seeing, even when wearing glasses/contacts?: No Does the patient have difficulty concentrating, remembering, or making decisions?: No  Permission Sought/Granted Permission sought to share information with : Facility Industrial/product designer granted to share information with : Yes, Verbal Permission Granted     Permission granted to share info w AGENCY: DME        Emotional Assessment Appearance:: Appears stated age Attitude/Demeanor/Rapport: Engaged Affect (typically observed): Accepting Orientation: : Oriented to Self, Oriented to Place, Oriented to  Time, Oriented to Situation Alcohol / Substance Use: Not Applicable Psych Involvement: No (comment)  Admission diagnosis:  Unstable angina (HCC) [I20.0] 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] Patient Active Problem List   Diagnosis Date Noted   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   Unstable angina (HCC) 08/13/2023   Syncope 11/24/2017   PCP:  Tally Joe, MD Pharmacy:   [facility] [id number] [address], [place] - [address] [place] [address] Phone: [phone] Fax: [phone]     Social Drivers of Health (SDOH) Social History: SDOH Screenings   Food Insecurity: No Food Insecurity (08/13/2023)  Housing: Low Risk  (08/13/2023)  Transportation Needs: No Transportation Needs (08/13/2023)  Utilities: Not At Risk (08/13/2023)  Tobacco Use:  Unknown (08/13/2023)   SDOH Interventions:     Readmission Risk Interventions    08/20/2023    1:52 PM  Readmission Risk Prevention Plan  Post Dischage Appt Complete  Medication Screening Complete  Transportation Screening Complete

## 2023-08-20 NOTE — Progress Notes (Signed)
CARDIAC REHAB PHASE I    Pt sitting in chair, feeling well today. Has just returned form walk in hallway with OT. Reports tolerating well with no SOB, dizziness or pain.  Post OHS education including site care, restrictions, heart healthy diet, sternal precautions, IS use at home, home needs at discharge, exercise guidelines and CRP2 reviewed. All questions and concerns addressed. Will refer to [facility] for CRP2.   [id number] Woodroe Chen, RN BSN 08/20/2023 9:42 AM

## 2023-08-20 NOTE — Progress Notes (Signed)
Mobility Specialist Progress Note:   08/20/23 1055  Mobility  Activity Ambulated with assistance in hallway  Level of Assistance Contact guard assist, steadying assist  Assistive Device Front wheel walker  Distance Ambulated (ft) 235 ft  RUE Weight Bearing Per Provider Order NWB  LUE Weight Bearing Per Provider Order NWB  Activity Response Tolerated well  Mobility Referral Yes  Mobility visit 1 Mobility  Mobility Specialist Start Time (ACUTE ONLY) 1020  Mobility Specialist Stop Time (ACUTE ONLY) 1035  Mobility Specialist Time Calculation (min) (ACUTE ONLY) 15 min   Pt received in chair, eager to mobility. Pt able to stand within precautions without assist. CG during ambulation for safety. C/o slight chest soreness, otherwise asx throughout. VSS. Pt left in BR and reminded to pull call bell when ready. NT notified.   Leory Plowman  Mobility Specialist Please contact via [employer] office at [phone]

## 2023-08-20 NOTE — Progress Notes (Signed)
Physical Therapy Treatment Patient Details Name: Rickey Lucas MRN: [id number] DOB: [birth year] Today's Date: 08/20/2023   History of Present Illness Pt is 67 yo male who presents on 08/13/23 with weakness in setting of CABG scheduled for 12/13. CABG x3 performed as scheduled. PMH: CAD, hypercholesterolemia    PT Comments  Pt received in chair, vitals remained stable today throughout session. Reviewed sternal precautions as they pertain to bed mobility and sit>stand. Pt able to stand with supervision. Pt ambulated 300' with rollator and supervision. Rollator would benefit pt for home as he likes to walk outside in neighborhood and having seat would be helpful. Practiced getting up and down with keeping sternal precautions. 2/10 chest soreness, 1/4 DOE. Recommend cardiac rehab at d/c, no further PT at d/c. PT will continue to follow acutely.     If plan is discharge home, recommend the following: A little help with walking and/or transfers;A little help with bathing/dressing/bathroom;Assist for transportation;Help with stairs or ramp for entrance   Can travel by private vehicle        Equipment Recommendations  BSC/3in1;Rollator (4 wheels) (shower seat)    Recommendations for Other Services       Precautions / Restrictions Precautions Precautions: Sternal;Other (comment) Precaution Booklet Issued: No Precaution Comments: reviewed precautions with pt as well as reviewing pictures on handout Restrictions Weight Bearing Restrictions Per Provider Order: No RUE Weight Bearing Per Provider Order: Non weight bearing LUE Weight Bearing Per Provider Order: Non weight bearing     Mobility  Bed Mobility               General bed mobility comments: received in chair but reviewed verbally with pics on handout    Transfers Overall transfer level: Needs assistance Equipment used: None Transfers: Sit to/from Stand Sit to Stand: Supervision           General transfer comment: pt  holds heart pillow to stand, no assist needed    Ambulation/Gait Ambulation/Gait assistance: Supervision Gait Distance (Feet): 300 Feet Assistive device: Rollator (4 wheels) Gait Pattern/deviations: Step-through pattern, Trunk flexed Gait velocity: decreased Gait velocity interpretation: <1.8 ft/sec, indicate of risk for recurrent falls   General Gait Details: worked on maintaining erect posture throughout gait. Pt liked having rollator and practiced turning and sitting. Pt likes to walk outside in neighborhood so this would be very helpful for him   Stairs             Wheelchair Mobility     Tilt Bed    Modified Rankin (Stroke Patients Only)       Balance Overall balance assessment: Mild deficits observed, not formally tested                                          Cognition Arousal: Alert Behavior During Therapy: WFL for tasks assessed/performed Overall Cognitive Status: Within Functional Limits for tasks assessed                                 General Comments: mildly slow to respond but shows good carryover from session with OT earlier in morning        Exercises      General Comments General comments (skin integrity, edema, etc.): HR 92 bpm, SPO2 96% on RA, BP 117/76, 1/4 DOE. Discussed scar massage to begin after  2 weeks      Pertinent Vitals/Pain Pain Assessment Pain Assessment: Faces Faces Pain Scale: Hurts a little bit Pain Location: chest when coughing Pain Descriptors / Indicators: Sore, Grimacing, Guarding Pain Intervention(s): Limited activity within patient's tolerance, Monitored during session    Home Living Family/patient expects to be discharged to:: Private residence Living Arrangements: Spouse/significant other Available Help at Discharge: Family;Available 24 hours/day Type of Home: House Home Access: Stairs to enter Entrance Stairs-Rails: None Entrance Stairs-Number of Steps: 4 Alternate Level  Stairs-Number of Steps: flight Home Layout: Two level;Able to live on main level with bedroom/bathroom Home Equipment: Shower seat - built in;Hand held shower head Additional Comments: brother in law staying with pt while wife at work. other family planning to be accessible to assist as well    Prior Function            PT Goals (current goals can now be found in the care plan section) Acute Rehab PT Goals Patient Stated Goal: feel better PT Goal Formulation: With patient Time For Goal Achievement: 09/02/23 Potential to Achieve Goals: Good Progress towards PT goals: Progressing toward goals    Frequency    Min 1X/week      PT Plan      Co-evaluation              AM-PAC PT "6 Clicks" Mobility   Outcome Measure  Help needed turning from your back to your side while in a flat bed without using bedrails?: A Little Help needed moving from lying on your back to sitting on the side of a flat bed without using bedrails?: A Little Help needed moving to and from a bed to a chair (including a wheelchair)?: A Little Help needed standing up from a chair using your arms (e.g., wheelchair or bedside chair)?: A Little Help needed to walk in hospital room?: A Little Help needed climbing 3-5 steps with a railing? : A Little 6 Click Score: 18    End of Session   Activity Tolerance: Patient tolerated treatment well Patient left: with call bell/phone within reach;in chair Nurse Communication: Mobility status PT Visit Diagnosis: Unsteadiness on feet (R26.81)     Time: 1610-9604 PT Time Calculation (min) (ACUTE ONLY): 30 min  Charges:    $Gait Training: 23-37 mins PT General Charges $$ ACUTE PT VISIT: 1 Visit                     Lyanne Co, PT  Acute Rehab Services Secure chat preferred Office [phone]    Benetta Spar L Mohammed Mcandrew 08/20/2023, 11:12 AM

## 2023-08-20 NOTE — Plan of Care (Signed)
  Problem: Activity: Goal: Ability to tolerate increased activity will improve Outcome: Progressing   Problem: Cardiac: Goal: Ability to achieve and maintain adequate cardiovascular perfusion will improve Outcome: Progressing   Problem: Education: Goal: Knowledge of General Education information will improve Description: Including pain rating scale, medication(s)/side effects and non-pharmacologic comfort measures Outcome: Progressing

## 2023-08-20 NOTE — Progress Notes (Addendum)
      [address]       [place] [address]             [phone]      4 Days Post-Op Procedure(s) (LRB): CORONARY ARTERY BYPASS GRAFTING (CABG) x THREE USING LEFT INTERNAL MAMMARY ARTERY AND RIGHT GREATER SAPHENEOUS VEIN HARVESTED ENDOSCOPICLY (N/A) TRANSESOPHAGEAL ECHOCARDIOGRAM (TEE) (N/A)  Subjective:  Patient states he feels odd.  He is very tired.  He has not been able to move his bowels.  He is willing to try a suppository today.  Events of yesterday noted with presyncope, dizziness, nausea.  Objective: Vital signs in last 24 hours: Temp:  [98.2 F (36.8 C)-98.9 F (37.2 C)] 98.9 F (37.2 C) (12/17 0329) Pulse Rate:  [77-88] 85 (12/17 0329) Cardiac Rhythm: Normal sinus rhythm (12/16 1900) Resp:  [13-21] 16 (12/17 0329) BP: (98-137)/(63-82) 124/82 (12/17 0329) SpO2:  [90 %-100 %] 95 % (12/17 0329) Weight:  [83.1 kg] 83.1 kg (12/17 0552)  Intake/Output from previous day: 12/16 0701 - 12/17 0700 In: 121 [P.O.:118; I.V.:3] Out: 2525 [Urine:2525]  General appearance: alert, cooperative, and no distress Heart: regular rate and rhythm Lungs: diminished breath sounds bibasilar Abdomen: soft, non-tender; bowel sounds normal; no masses,  no organomegaly Extremities: edema trace Wound: clean and dry  Lab Results: Recent Labs    08/17/23 1905 08/18/23 0400  WBC 11.6* 10.9*  HGB 11.3* 10.3*  HCT 34.2* 31.3*  PLT 166 140*   BMET:  Recent Labs    08/17/23 1905 08/18/23 0400  NA 133* 133*  K 3.8 4.1  CL 99 98  CO2 26 25  GLUCOSE 202* 116*  BUN 10 11  CREATININE 1.18 1.00  CALCIUM 7.7* 7.9*    PT/INR: No results for input(s): "LABPROT", "INR" in the last 72 hours. ABG    Component Value Date/Time   PHART 7.307 (L) 08/16/2023 1836   HCO3 23.7 08/16/2023 1836   TCO2 25 08/16/2023 1836   ACIDBASEDEF 2.0 08/16/2023 1836   O2SAT 95 08/16/2023 1836   CBG (last 3)  Recent Labs    08/18/23 0318 08/18/23 0728 08/19/23 1238  GLUCAP 115*  137* 127*    Assessment/Plan: S/P Procedure(s) (LRB): CORONARY ARTERY BYPASS GRAFTING (CABG) x THREE USING LEFT INTERNAL MAMMARY ARTERY AND RIGHT GREATER SAPHENEOUS VEIN HARVESTED ENDOSCOPICLY (N/A) TRANSESOPHAGEAL ECHOCARDIOGRAM (TEE) (N/A)  CV- NSR, remains hypotensive at times, continue Midodrine 10 mg TID, no BB at this time Pulm- off oxygen, no acute issues, continue IS Renal- creatinine has been stable, per patient baseline weight was about 180, he is down to 183.. will d/c Demadex with dizziness, stop potassium, repeat BMET in AM GI-constipation persists, continue Miralax, try suppository today Dispo- patient stable, will try suppository today, stop Demadex with associated dizziness to see if this improves, repeat BMET, CXR in AM.. if remains stable and symptoms improve for possible d/c in AM   LOS: 7 days    Lowella Dandy, PA-C 08/20/2023  Patient seen and examined, agree with findings and plan outlined above Just back from walk. Making progress, but no BM yet  Viviann Spare C. Dorris Fetch, MD [facility] [phone]

## 2023-08-21 ENCOUNTER — Inpatient Hospital Stay (HOSPITAL_COMMUNITY): Payer: Medicare Other

## 2023-08-21 DIAGNOSIS — Z951 Presence of aortocoronary bypass graft: Secondary | ICD-10-CM

## 2023-08-21 LAB — BASIC METABOLIC PANEL
Anion gap: 10 (ref 5–15)
BUN: 14 mg/dL (ref 8–23)
CO2: 26 mmol/L (ref 22–32)
Calcium: 8.5 mg/dL — ABNORMAL LOW (ref 8.9–10.3)
Chloride: 100 mmol/L (ref 98–111)
Creatinine, Ser: 0.87 mg/dL (ref 0.61–1.24)
GFR, Estimated: >60 mL/min (ref >60)
Glucose, Bld: 112 mg/dL — ABNORMAL HIGH (ref 70–99)
Potassium: 3.8 mmol/L (ref 3.5–5.1)
Sodium: 136 mmol/L (ref 135–145)

## 2023-08-21 MED ORDER — ROSUVASTATIN CALCIUM 20 MG PO TABS: 20.0000 mg | ORAL_TABLET | Freq: Every day | ORAL | 3 refills | Status: DC

## 2023-08-21 MED ORDER — TRAMADOL HCL 50 MG PO TABS: 50.0000 mg | ORAL_TABLET | ORAL | 0 refills | Status: DC | PRN

## 2023-08-21 MED ORDER — MIDODRINE HCL 5 MG PO TABS: 5.0000 mg | ORAL_TABLET | Freq: Two times a day (BID) | ORAL | 0 refills | Status: DC

## 2023-08-21 MED ORDER — FUROSEMIDE 40 MG PO TABS: 40.0000 mg | ORAL_TABLET | Freq: Every day | ORAL | 1 refills | Status: DC | PRN

## 2023-08-21 MED ORDER — POTASSIUM CHLORIDE CRYS ER 20 MEQ PO TBCR: 20.0000 meq | EXTENDED_RELEASE_TABLET | Freq: Every day | ORAL | 1 refills | Status: DC | PRN

## 2023-08-21 MED FILL — Heparin Sodium (Porcine) Inj 1000 Unit/ML: Qty: 1000 | Status: AC

## 2023-08-21 MED FILL — Magnesium Sulfate Inj 50%: INTRAMUSCULAR | Qty: 10 | Status: AC

## 2023-08-21 MED FILL — Potassium Chloride Inj 2 mEq/ML: INTRAVENOUS | Qty: 40 | Status: AC

## 2023-08-21 MED FILL — Sodium Chloride IV Soln 0.9%: INTRAVENOUS | Qty: 100 | Status: AC

## 2023-08-21 MED FILL — Insulin Regular (Human) Inj 100 Unit/ML: INTRAMUSCULAR | Qty: 1 | Status: CN

## 2023-08-21 NOTE — Progress Notes (Signed)
CARDIAC REHAB PHASE I   Pt ready for discharge home. Postop OHS education completed yesterday. All questions and concerns addressed. Referral for CRP2 sent to [facility].  [phone]  Woodroe Chen, RN BSN 08/21/2023 10:13 AM

## 2023-08-21 NOTE — TOC Transition Note (Signed)
Transition of Care (TOC) - Discharge Note Donn Pierini RN, BSN Transitions of Care Unit 4E- RN Case Manager See Treatment Team for direct phone #   Patient Details  Name: Rickey Lucas MRN: [id number] Date of Birth: [birth year]  Transition of Care [facility]) CM/SW Contact:  Darrold Span, RN Phone Number: 08/21/2023, 11:47 AM   Clinical Narrative:    Pt stable for transition home today, DME arranged yesterday for rollator to be delivered to room prior to discharge- per bedside RN DME still pending delivery.   CM checked with [employer] liaison- not sure why DME has not been delivered- was processed yesterday- liaison to request expedited delivery this am- rollator coming to room.   Family at bedside to transport home, No other needs from [facility] noted.    Final next level of care: Home/Self Care Barriers to Discharge: No Barriers Identified   Patient Goals and CMS Choice Patient states their goals for this hospitalization and ongoing recovery are:: return home   Choice offered to / list presented to : Patient      Discharge Placement               Home        Discharge Plan and Services Additional resources added to the After Visit Summary for     Discharge Planning Services: CM Consult Post Acute Care Choice: Durable Medical Equipment          DME Arranged: Walker rolling with seat DME Agency: [employer] Date DME Agency Contacted: 08/20/23 Time DME Agency Contacted: 1351 Representative spoke with at DME Agency: Mitch HH Arranged: NA HH Agency: NA        Social Drivers of Health (SDOH) Interventions SDOH Screenings   Food Insecurity: No Food Insecurity (08/13/2023)  Housing: Low Risk  (08/13/2023)  Transportation Needs: No Transportation Needs (08/13/2023)  Utilities: Not At Risk (08/13/2023)  Tobacco Use: Unknown (08/13/2023)     Readmission Risk Interventions    08/20/2023    1:52 PM  Readmission Risk Prevention Plan  Post Dischage Appt  Complete  Medication Screening Complete  Transportation Screening Complete

## 2023-08-21 NOTE — Plan of Care (Signed)
Problem: Education: Goal: Understanding of cardiac disease, CV risk reduction, and recovery process will improve Outcome: Adequate for Discharge   Problem: Activity: Goal: Ability to tolerate increased activity will improve Outcome: Adequate for Discharge   Problem: Cardiac: Goal: Ability to achieve and maintain adequate cardiovascular perfusion will improve Outcome: Adequate for Discharge   Problem: Health Behavior/Discharge Planning: Goal: Ability to safely manage health-related needs after discharge will improve Outcome: Adequate for Discharge   Problem: Education: Goal: Knowledge of General Education information will improve Description: Including pain rating scale, medication(s)/side effects and non-pharmacologic comfort measures Outcome: Adequate for Discharge   Problem: Health Behavior/Discharge Planning: Goal: Ability to manage health-related needs will improve Outcome: Adequate for Discharge   Problem: Clinical Measurements: Goal: Ability to maintain clinical measurements within normal limits will improve Outcome: Adequate for Discharge Goal: Will remain free from infection Outcome: Adequate for Discharge Goal: Diagnostic test results will improve Outcome: Adequate for Discharge Goal: Respiratory complications will improve Outcome: Adequate for Discharge Goal: Cardiovascular complication will be avoided Outcome: Adequate for Discharge   Problem: Activity: Goal: Risk for activity intolerance will decrease Outcome: Adequate for Discharge   Problem: Nutrition: Goal: Adequate nutrition will be maintained Outcome: Adequate for Discharge   Problem: Coping: Goal: Level of anxiety will decrease Outcome: Adequate for Discharge   Problem: Elimination: Goal: Will not experience complications related to bowel motility Outcome: Adequate for Discharge Goal: Will not experience complications related to urinary retention Outcome: Adequate for Discharge   Problem:  Pain Management: Goal: General experience of comfort will improve Outcome: Adequate for Discharge   Problem: Safety: Goal: Ability to remain free from injury will improve Outcome: Adequate for Discharge   Problem: Skin Integrity: Goal: Risk for impaired skin integrity will decrease Outcome: Adequate for Discharge   Problem: Education: Goal: Will demonstrate proper wound care and an understanding of methods to prevent future damage Outcome: Adequate for Discharge Goal: Knowledge of disease or condition will improve Outcome: Adequate for Discharge Goal: Knowledge of the prescribed therapeutic regimen will improve Outcome: Adequate for Discharge Goal: Individualized Educational Video(s) Outcome: Adequate for Discharge   Problem: Activity: Goal: Risk for activity intolerance will decrease Outcome: Adequate for Discharge   Problem: Cardiac: Goal: Will achieve and/or maintain hemodynamic stability Outcome: Adequate for Discharge   Problem: Clinical Measurements: Goal: Postoperative complications will be avoided or minimized Outcome: Adequate for Discharge   Problem: Respiratory: Goal: Respiratory status will improve Outcome: Adequate for Discharge   Problem: Skin Integrity: Goal: Wound healing without signs and symptoms of infection Outcome: Adequate for Discharge Goal: Risk for impaired skin integrity will decrease Outcome: Adequate for Discharge   Problem: Urinary Elimination: Goal: Ability to achieve and maintain adequate renal perfusion and functioning will improve Outcome: Adequate for Discharge   Problem: Acute Rehab PT Goals(only PT should resolve) Goal: Pt will Roll Supine to Side Outcome: Adequate for Discharge Goal: Pt Will Go Supine/Side To Sit Outcome: Adequate for Discharge Goal: Patient Will Transfer Sit To/From Stand Outcome: Adequate for Discharge Goal: Pt Will Perform Standing Balance Or Pre-Gait Outcome: Adequate for Discharge Goal: Pt Will Go  Up/Down Stairs Outcome: Adequate for Discharge Goal: Pt Will Verbalize and Adhere to Precautions While Description: PT Will Verbalize and Adhere to Precautions While Performing Mobility Outcome: Adequate for Discharge   Problem: Acute Rehab PT Goals(only PT should resolve) Goal: Pt Will Ambulate Outcome: Adequate for Discharge   Problem: Acute Rehab OT Goals (only OT should resolve) Goal: Pt. Will Perform Upper Body Dressing Outcome: Adequate for  Discharge Goal: Pt. Will Perform Lower Body Dressing Outcome: Adequate for Discharge Goal: Pt. Will Perform Toileting-Clothing Manipulation Outcome: Adequate for Discharge

## 2023-08-21 NOTE — Progress Notes (Signed)
Discharge instructions (including medications) discussed with and copy provided to patient/caregiver 

## 2023-08-21 NOTE — Progress Notes (Addendum)
      [address]       [place] [address]             [phone]       5 Days Post-Op Procedure(s) (LRB): CORONARY ARTERY BYPASS GRAFTING (CABG) x THREE USING LEFT INTERNAL MAMMARY ARTERY AND RIGHT GREATER SAPHENEOUS VEIN HARVESTED ENDOSCOPICLY (N/A) TRANSESOPHAGEAL ECHOCARDIOGRAM (TEE) (N/A)  Subjective:  Patient sitting up in chair.  Doing well, no further dizziness.  He was able to move his bowels yesterday.  Objective: Vital signs in last 24 hours: Temp:  [98.4 F (36.9 C)-98.9 F (37.2 C)] 98.7 F (37.1 C) (12/18 0328) Pulse Rate:  [87-92] 88 (12/18 0328) Cardiac Rhythm: Normal sinus rhythm (12/17 2030) Resp:  [14-20] 18 (12/18 0328) BP: (107-131)/(71-83) 131/77 (12/18 0328) SpO2:  [91 %-97 %] 91 % (12/18 0328) Weight:  [82.8 kg] 82.8 kg (12/18 0328)  Intake/Output from previous day: 12/17 0701 - 12/18 0700 In: -  Out: 400 [Urine:400]  General appearance: alert, cooperative, and no distress Heart: regular rate and rhythm Lungs: clear to auscultation bilaterally Abdomen: soft, non-tender; bowel sounds normal; no masses,  no organomegaly Extremities: edema trace Wound: clean and dr4  Lab Results: No results for input(s): "WBC", "HGB", "HCT", "PLT" in the last 72 hours. BMET:  Recent Labs    08/21/23 0317  NA 136  K 3.8  CL 100  CO2 26  GLUCOSE 112*  BUN 14  CREATININE 0.87  CALCIUM 8.5*    PT/INR: No results for input(s): "LABPROT", "INR" in the last 72 hours. ABG    Component Value Date/Time   PHART 7.307 (L) 08/16/2023 1836   HCO3 23.7 08/16/2023 1836   TCO2 25 08/16/2023 1836   ACIDBASEDEF 2.0 08/16/2023 1836   O2SAT 95 08/16/2023 1836   CBG (last 3)  Recent Labs    08/19/23 1238  GLUCAP 127*    Assessment/Plan: S/P Procedure(s) (LRB): CORONARY ARTERY BYPASS GRAFTING (CABG) x THREE USING LEFT INTERNAL MAMMARY ARTERY AND RIGHT GREATER SAPHENEOUS VEIN HARVESTED ENDOSCOPICLY (N/A) TRANSESOPHAGEAL ECHOCARDIOGRAM (TEE)  (N/A)  CV- NSR, BP improving- will decrease Midodrine to 5 mg BID, can hopefully d/c at next visit Pulm- off oxygen, CXR with trace bilateral effusions/atelectasis continue IS Renal- creatinine is stable, weight continues to trend down, will plan for PRN Lasix at discharge GI- constipation resolved Dispo- patient doing well, remains in NSR, BP improving will decrease Midodrine, will d/c home today   LOS: 8 days   Lowella Dandy, PA-C 08/21/2023  Patient examined, images of todays CXR reviewed. Patient walking well in hall, incisions clean and dry, stable vitals with decrease in midodrine dosing. Ready for discharge home, activity limits and wound care reviewed with patient.All questions addressed.  patient examined and medical record reviewed,agree with above note. Lovett Sox 08/21/2023

## 2023-08-22 MED FILL — Heparin Sodium (Porcine) Inj 1000 Unit/ML: INTRAMUSCULAR | Qty: 20 | Status: AC

## 2023-08-22 MED FILL — Mannitol IV Soln 20%: INTRAVENOUS | Qty: 500 | Status: AC

## 2023-08-22 MED FILL — Sodium Chloride IV Soln 0.9%: INTRAVENOUS | Qty: 2000 | Status: AC

## 2023-08-22 MED FILL — Lidocaine HCl Local Soln Prefilled Syringe 100 MG/5ML (2%): INTRAMUSCULAR | Qty: 5 | Status: AC

## 2023-08-22 MED FILL — Sodium Bicarbonate IV Soln 8.4%: INTRAVENOUS | Qty: 50 | Status: AC

## 2023-08-22 MED FILL — Heparin Sodium (Porcine) Inj 1000 Unit/ML: INTRAMUSCULAR | Qty: 30 | Status: AC

## 2023-08-22 MED FILL — Electrolyte-R (PH 7.4) Solution: INTRAVENOUS | Qty: 3000 | Status: AC

## 2023-08-26 DIAGNOSIS — Z4802 Encounter for removal of sutures: Secondary | ICD-10-CM

## 2023-09-05 ENCOUNTER — Ambulatory Visit: Payer: Medicare Other | Attending: Physician Assistant | Admitting: Physician Assistant

## 2023-09-05 VITALS — BP 130/74 | HR 86 | Ht 71.5 in | Wt 179.0 lb

## 2023-09-05 DIAGNOSIS — E785 Hyperlipidemia, unspecified: Secondary | ICD-10-CM

## 2023-09-05 DIAGNOSIS — Z951 Presence of aortocoronary bypass graft: Secondary | ICD-10-CM | POA: Diagnosis present

## 2023-09-05 DIAGNOSIS — G479 Sleep disorder, unspecified: Secondary | ICD-10-CM | POA: Diagnosis present

## 2023-09-05 NOTE — Patient Instructions (Addendum)
 Medication Instructions:  Your physician has recommended you make the following change in your medication:  STOP: Midodrine  (Proamatine ) *If you need a refill on your cardiac medications before your next appointment, please call your pharmacy*   Follow-Up: At [facility], you and your health needs are our priority.  As part of our continuing mission to provide you with exceptional heart care, we have created designated Provider Care Teams.  These Care Teams include your primary Cardiologist (physician) and Advanced Practice Providers (APPs -  Physician Assistants and Nurse Practitioners) who all work together to provide you with the care you need, when you need it.   Your next appointment:   4 week(s)  Provider:   Scot Ford, PA-C then, Jerel Balding, MD will plan to see you again in 4 month(s).

## 2023-09-05 NOTE — Progress Notes (Signed)
 Cardiology Office Note:  .   Date:  09/05/2023  ID:  Oneil LITTIE Ellen, DOB 1956-05-19, MRN [id number] PCP: Seabron Lenis, MD  [facility] Providers Cardiologist:  Jerel Balding, MD     History of Present Illness: SABRA   DAKARI CREGGER is a 68 y.o. male with PMH of CAD s/p CABG and HLD. 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  Patient had a remote episode of syncope in 2019.  He was recently seen by Dr. Balding in January for evaluation of episode of tachycardia and elevated calcium  score.  Subsequent coronary CT obtained on 07/26/2023 showed moderate to possibly severe mixed CAD.  FFR suggested significant LAD and PDA disease.  Subsequent cardiac catheterization performed on 08/09/2023 showed 75% mid to distal left main lesion, 70% proximal to mid LAD lesion, 50% RPA V lesion, 30% proximal RCA lesion.  Echocardiogram obtained on the same day showed EF 60 to 65%, no regional wall motion abnormality, normal RV, trivial MR.  CT surgery service was consulted.Pre-CABG Doppler obtained on 08/15/2023 showed nearly normal carotid arteries with normal vertebral arteries.  He was seen by Dr. Lucas in the office on 08/12/2023 to discuss CABG, unfortunately prior to his surgery, he came back with unstable angina on 08/13/2023.  He ultimately underwent CABG x 3 with LIMA-LAD, SVG-D1 and SVG-OM.  He tolerated the surgery well.  He was able to extubate in the afternoon of the surgery.  He had expected volume overload to require IV diuresis and transition to torsemide  afterward.  He was not started on beta-blocker early postoperatively due to marginal blood pressure.  He was able to maintain normal sinus rhythm.  Torsemide  was discontinued prior to discharge.  He was discharged on 5 mg twice a day of midodrine .  Since discharge, he has been doing very well.  He is accompanied by brother-in-law today. He has occasional incision scar soreness but no significant anginal symptom.  I asked him to stop midodrin 5 mg  twice a day, if systolic blood pressure dropped below 100, he has been instructed to take 2.5 mg measuring instead.  He will continue on aspirin  and Crestor .  If his blood pressure is up on the next follow-up, I plan to add back a low-dose beta-blocker.  Otherwise, he has been doing well and can start on cardiac rehab once he see Dr. Lucas in the office.  I plan to see the patient back in 4 weeks and a follow-up with Dr. Balding in 13-month. He may need sleep study at some point, but like to wait until he recover from CABG first, likely 3 month after discharge.    ROS:   He denies chest pain, palpitations, dyspnea, pnd, orthopnea, n, v, dizziness, syncope, edema, weight gain, or early satiety. All other systems reviewed and are otherwise negative except as noted above.    Studies Reviewed: SABRA   EKG Interpretation Date/Time:  Thursday September 05 2023 11:09:31 EST Ventricular Rate:  80 PR Interval:  156 QRS Duration:  80 QT Interval:  374 QTC Calculation: 431 R Axis:   -22  Text Interpretation: Normal sinus rhythm Normal ECG Confirmed by Janene Boer [phone]) on 09/05/2023 11:54:28 AM    Cardiac Studies & Procedures   CARDIAC CATHETERIZATION  CARDIAC CATHETERIZATION 08/09/2023  Narrative   Mid LM to Dist LM lesion is 75% stenosed.   Prox LAD to Mid LAD lesion is 70% stenosed.   Prox RCA lesion is 30% stenosed.   RPAV lesion is 50% stenosed.  Severe non-calcified distal left main stenosis of 75% Severe calcified proximal LAD stenosis of 70-75% Patent LCx with large, graftable OM Large, dominant RCA with mild nonobstructive plaquing Normal LVEDP  Recommendations: Outpatient cardiac surgical evaluation for CABG. Advised to avoid strenuous activity. Otherwise, continue current medical therapy.  Findings Coronary Findings Diagnostic  Dominance: Right  Left Main Mid LM to Dist LM lesion is 75% stenosed. The lesion is eccentric.  Left Anterior Descending Prox LAD to Mid LAD lesion is  70% stenosed. The lesion is calcified.  Left Circumflex The vessel exhibits minimal luminal irregularities.  Right Coronary Artery There is mild diffuse disease throughout the vessel. Prox RCA lesion is 30% stenosed.  Right Posterior Atrioventricular Artery RPAV lesion is 50% stenosed.  Intervention  No interventions have been documented.    ECHOCARDIOGRAM  ECHOCARDIOGRAM COMPLETE 08/09/2023  Narrative ECHOCARDIOGRAM REPORT    Patient Name:   DR. Jashun L Schlossberg Date of Exam: 08/09/2023 Medical Rec #:  [id number]           Height:       71.0 in Accession #:    [id number]          Weight:       180.0 lb Date of Birth:  02-19-1956            BSA:          2.016 m Patient Age:    67 years            BP:           132/72 mmHg Patient Gender: M                   HR:           60 bpm. Exam Location:  Outpatient  Procedure: 2D Echo, Color Doppler and Cardiac Doppler  Indications:    CAD of native vessel  History:        Patient has no prior history of Echocardiogram examinations. Risk Factors:Family History of Coronary Artery Disease and Dyslipidemia.  Sonographer:    Tinnie Barefoot RDCS Referring Phys: [phone] MICHAEL COOPER  IMPRESSIONS   1. Left ventricular ejection fraction, by estimation, is 60 to 65%. The left ventricle has normal function. The left ventricle has no regional wall motion abnormalities. Left ventricular diastolic parameters are consistent with Grade I diastolic dysfunction (impaired relaxation). The average left ventricular global longitudinal strain is -18.0 %. The global longitudinal strain is normal. 2. Right ventricular systolic function is normal. The right ventricular size is normal. Tricuspid regurgitation signal is inadequate for assessing PA pressure. 3. The mitral valve is grossly normal. Trivial mitral valve regurgitation. 4. The aortic valve is tricuspid. Aortic valve regurgitation is not visualized. 5. The inferior vena cava is normal in size  with greater than 50% respiratory variability, suggesting right atrial pressure of 3 mmHg.  FINDINGS Left Ventricle: Left ventricular ejection fraction, by estimation, is 60 to 65%. The left ventricle has normal function. The left ventricle has no regional wall motion abnormalities. The average left ventricular global longitudinal strain is -18.0 %. The global longitudinal strain is normal. The left ventricular internal cavity size was normal in size. There is no left ventricular hypertrophy. Left ventricular diastolic parameters are consistent with Grade I diastolic dysfunction (impaired relaxation).  Right Ventricle: The right ventricular size is normal. Right ventricular systolic function is normal. Tricuspid regurgitation signal is inadequate for assessing PA pressure.  Left Atrium: Left atrial size was normal in size.  Right Atrium: Right atrial size was normal in size.  Pericardium: There is no evidence of pericardial effusion.  Mitral Valve: The mitral valve is grossly normal. Trivial mitral valve regurgitation.  Tricuspid Valve: Tricuspid valve regurgitation is not demonstrated.  Aortic Valve: The aortic valve is tricuspid. Aortic valve regurgitation is not visualized.  Pulmonic Valve: Pulmonic valve regurgitation is not visualized.  Aorta: The aortic root and ascending aorta are structurally normal, with no evidence of dilitation.  Venous: The inferior vena cava is normal in size with greater than 50% respiratory variability, suggesting right atrial pressure of 3 mmHg.  IAS/Shunts: No atrial level shunt detected by color flow Doppler.   LEFT VENTRICLE PLAX 2D LVIDd:         5.10 cm   Diastology LVIDs:         3.40 cm   LV e' medial:    8.16 cm/s LV PW:         1.00 cm   LV E/e' medial:  6.9 LV IVS:        0.70 cm   LV e' lateral:   10.80 cm/s LVOT diam:     1.90 cm   LV E/e' lateral: 5.2 LV SV:         52 LV SV Index:   26        2D Longitudinal Strain LVOT Area:      2.84 cm  2D Strain GLS Avg:     -18.0 %   RIGHT VENTRICLE             IVC RV Basal diam:  2.60 cm     IVC diam: 1.50 cm RV S prime:     11.20 cm/s TAPSE (M-mode): 1.9 cm  LEFT ATRIUM             Index        RIGHT ATRIUM           Index LA diam:        3.20 cm 1.59 cm/m   RA Area:     13.90 cm LA Vol (A2C):   34.6 ml 17.16 ml/m  RA Volume:   33.30 ml  16.52 ml/m LA Vol (A4C):   40.3 ml 19.99 ml/m LA Biplane Vol: 40.9 ml 20.29 ml/m AORTIC VALVE LVOT Vmax:   90.30 cm/s LVOT Vmean:  55.600 cm/s LVOT VTI:    0.183 m  AORTA Ao Root diam: 3.20 cm Ao Asc diam:  2.90 cm  MITRAL VALVE MV Area (PHT): 2.77 cm    SHUNTS MV Decel Time: 274 msec    Systemic VTI:  0.18 m MV E velocity: 56.30 cm/s  Systemic Diam: 1.90 cm MV A velocity: 78.80 cm/s MV E/A ratio:  0.71  [place] signed by Ronal Ross Signature Date/Time: 08/09/2023/5:16:04 PM    Final  TEE  ECHO INTRAOPERATIVE TEE 08/16/2023  Narrative *INTRAOPERATIVE TRANSESOPHAGEAL REPORT *    Patient Name:   DR. Maddex L Hummel Date of Exam: 08/16/2023 Medical Rec #:  [id number]           Height:       71.0 in Accession #:    [id number]          Weight:       177.2 lb Date of Birth:  1956-04-30            BSA:          2.00 m Patient Age:    63 years  BP: Patient Gender: M                   HR: Exam Location:  Anesthesiology  Transesophogeal exam was perform intraoperatively during surgical procedure. Patient was closely monitored under general anesthesia during the entirety of examination.  Indications:     CABG Performing Phys: Lynwood MARLA Cornea MD Diagnosing Phys: Lynwood Cornea  Complications: No known complications during this procedure. POST-OP IMPRESSIONS _ Left Ventricle: The left ventricular function appears low-normal, consistent with immediate post-bypass state/ventricular pacing. _ Right Ventricle: The right ventricle appears unchanged from pre-bypass, with normal systolic  function. _ Aorta: The aorta appears unchanged from pre-bypass. There is no dissection present in the aorta. _ Aortic Valve: The aortic valve appears unchanged from pre-bypass. _ Mitral Valve: The mitral valve appears unchanged from pre-bypass. There is mild regurgitation. _ Tricuspid Valve: The tricuspid valve appears unchanged from pre-bypass. There is mild regurgitation. _ Pulmonic Valve: The pulmonic valve appears unchanged from pre-bypass. _ Interatrial Septum: The interatrial septum appears unchanged from pre-bypass. _ Pericardium: The pericardium appears unchanged from pre-bypass. No pericardial effusion visualized.  PRE-OP FINDINGS Left Ventricle: The left ventricle has normal systolic function, with an ejection fraction of 55-60%. The cavity size was normal. Left ventricular diastolic function was not evaluated. No significant wall motion abnormality.   Right Ventricle: The right ventricle has normal systolic function. There is no increase in right ventricular wall thickness. Catheter present in the right ventricle.  Left Atrium: Left atrial size was not assessed. No left atrial/left atrial appendage thrombus was detected.  Right Atrium: Right atrial size was not assessed.  Interatrial Septum: No atrial level shunt detected by color flow Doppler.  Pericardium: There is no evidence of pericardial effusion. There is no pleural effusion.  Mitral Valve: The mitral valve is normal in structure. Mitral valve regurgitation is mild by color flow Doppler. There is no evidence of mitral stenosis.  Tricuspid Valve: The tricuspid valve was normal in structure. Tricuspid valve regurgitation was not visualized by color flow Doppler. No evidence of tricuspid stenosis is present.  Aortic Valve: The aortic valve is normal in structure. Aortic valve regurgitation was not visualized by color flow Doppler. There is no stenosis of the aortic valve.  Pulmonic Valve: The pulmonic valve was normal  in structure No evidence of pumonic stenosis. Pulmonic valve regurgitation is mild by color flow Doppler.   Aorta: The aortic root and aortic arch are normal in size and structure.  +--------------+--------++ LEFT VENTRICLE         +--------------+--------++ PLAX 2D                +--------------+--------++ LVOT diam:    2.40 cm  +--------------+--------++ LVOT Area:    4.52 cm +--------------+--------++                        +--------------+--------++  +-------------+-----------++ AORTIC VALVE             +-------------+-----------++ AV Vmax:     105.00 cm/s +-------------+-----------++ AV Vmean:    71.367 cm/s +-------------+-----------++ AV VTI:      0.226 m     +-------------+-----------++ AV Peak Grad:4.4 mmHg    +-------------+-----------++ AV Mean Grad:2.3 mmHg    +-------------+-----------++   Lynwood Cornea Electronically signed by Lynwood Cornea Signature Date/Time: 08/19/2023/7:40:30 PM    Final   CT SCANS  CT CORONARY MORPH W/CTA COR W/SCORE 07/26/2023  Addendum 08/11/2023  7:33 PM ADDENDUM REPORT: 08/11/2023 19:31  EXAM: OVER-READ INTERPRETATION  CT CHEST  The following report is an over-read performed by radiologist Dr. Ester Imam [facility] on 08/11/2023. This over-read does not include interpretation of cardiac or coronary anatomy or pathology. The coronary CTA interpretation by the cardiologist is attached.  COMPARISON:  02/01/2022  FINDINGS: Cardiovascular: The heart is normal in size.  Mediastinum/Nodes: Visualized esophagus and trachea within normal limits. No mediastinal lymphadenopathy.  Lungs/Pleura: The lungs are clear bilaterally.  Upper Abdomen: The visualized upper abdomen is within normal limits.  Musculoskeletal: No acute osseous abnormality.  IMPRESSION: No acute or significant intrathoracic abnormality.  Ester Sides, MD  [facility]  [facility]   Electronically Signed By: Ester Sides M.D. On: 08/11/2023 19:31  Narrative HISTORY: 68 yo male with chest pain/anginal equiv, high CAD risk, treadmill candidate ANGINA  EXAM: Cardiac/Coronary CTA  TECHNIQUE: The patient was scanned on a [employer].  PROTOCOL: A 120 kV prospective scan was triggered in the descending thoracic aorta at 111 HU's. Axial non-contrast 3 mm slices were carried out through the heart. The data set was analyzed on a dedicated work station and scored using the Agatson method. Gantry rotation speed was 250 msecs and collimation was .6 mm. Beta blockade and 0.8 mg of sl NTG was given. The 3D data set was reconstructed in 5% intervals of the 35-75 % of the R-R cycle. Diastolic phases were analyzed on a dedicated work station using MPR, MIP and VRT modes. The patient received 100mL OMNIPAQUE  IOHEXOL  350 MG/ML SOLN contrast.  FINDINGS: Quality: Good, HR 54  Coronary calcium  score: The patient's coronary artery calcium  score is 468, which places the patient in the 75th percentile (MESA).  Coronary arteries: Normal coronary origins.  Right dominance.  Right Coronary Artery: Dominant. There is minimal mixed 1-24% proximal and distal stenosis. The R-PLB branch is normal. The proximal R-PDA branch has moderate to likely severe stenosis (70-99%) at a 90 degree bend in the vessel.  Left Main Coronary Artery: Normal. Bifurcates into the LAD and LCX arteries.  Left Anterior Descending Coronary Artery: Heavy calcification of the ostial/proximal LAD with at least moderate stenosis (50-69%). Tortuous D1 branch with minimal mixed 1-24% proximal stenosis. Smaller D2 branch without disease.  Left Circumflex Artery: AV groove vessel with ostial/proximal mixed plaque with low attenuation features and (50-69%) mixed stenosis. The distal LCX is normal. Large OM branch with minimal 1-24% proximal non-calcified  stenosis.  Aorta: Normal size, 35 mm at the mid ascending aorta (level of the PA bifurcation) measured double oblique. No calcifications. No dissection.  Aortic Valve: Trileaflet. No calcifications.  Other findings:  Normal pulmonary vein drainage into the left atrium.  Normal left atrial appendage without a thrombus.  Normal size of the pulmonary artery.  IMPRESSION: 1. 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 CT FFR will be performed and reported separately.  2. Coronary artery calcium  score is 468, which places the patient in the 75th percentile for age/race and sex-matched control (MESA).  3. Normal coronary origin with right dominance.  4. Aggressive risk factor modification recommended.  Electronically Signed: By: Vinie JAYSON Maxcy M.D. On: 07/29/2023 09:40          Risk Assessment/Calculations:             Physical Exam:   VS:  BP 130/74 (BP Location: Right Arm, Patient Position: Sitting, Cuff Size: Normal)   Pulse 86   Ht 5' 11.5 (1.816 m)   Wt 179  lb (81.2 kg)   SpO2 98%   BMI 24.62 kg/m    Wt Readings from Last 3 Encounters:  09/05/23 179 lb (81.2 kg)  08/21/23 182 lb 8 oz (82.8 kg)  08/12/23 183 lb (83 kg)    GEN: Well nourished, well developed in no acute distress NECK: No JVD; No carotid bruits CARDIAC: RRR, no murmurs, rubs, gallops RESPIRATORY:  Clear to auscultation without rales, wheezing or rhonchi  ABDOMEN: Soft, non-tender, non-distended EXTREMITIES:  No edema; No deformity   ASSESSMENT AND PLAN: .     Post Coronary Artery Bypass Graft (CABG) Recent CABG x3 with Lima-OED, SVG-D1, and SVG-OM. Stable post-operative course with expected volume overload managed with IV diuresis and transition to torsemide , which was discontinued prior to discharge. Maintained normal sinus rhythm. No early postoperative beta-blocker due to marginal blood pressure. -Discontinue midodrine , monitor blood pressure at home. If systolic blood pressure  <100, take half a tablet of midodrine  as needed. -Consider reintroduction of beta-blocker at follow-up visit in 4 weeks, depending on blood pressure.  Hyperlipidemia: Continue Crestor   Sleep Disturbance Reports irregular sleep patterns post-surgery, possibly due to positional discomfort. Concerns about potential sleep apnea. -Plan for sleep study in approximately 3 months, once fully recovered from surgery.     Cardiac Rehabilitation Eligibility Assessment  The patient is ready to start cardiac rehabilitation pending clearance from the cardiac surgeon.       Dispo: Follow-up in 4 weeks with me and follow-up with Dr. Francyne in 4 months  Signed, Zoraya Fiorenza, [place]

## 2023-09-11 NOTE — Patient Instructions (Signed)
 Make every effort to maintain a "heart-healthy" lifestyle with regular physical exercise and adherence to a low-fat, low-carbohydrate diet.  Continue to seek regular follow-up appointments with your primary care physician and/or cardiologist.  You are encouraged to enroll and participate in the outpatient cardiac rehab program beginning as soon as practical.   You may return to driving an automobile as long as you are no longer requiring oral narcotic pain relievers during the daytime.  It would be wise to start driving only short distances during the daylight and gradually increase from there as you feel comfortable.   Make every effort to stay physically active, get some type of exercise on a regular basis, and stick to a "heart healthy diet".  The long term benefits for regular exercise and a healthy diet are critically important to your overall health and wellbeing.  You may continue to gradually increase your physical activity as tolerated.  Refrain from any heavy lifting or strenuous use of your arms and shoulders until at least 8 weeks from the time of your surgery, and avoid activities that cause increased pain in your chest on the side of your surgical incision.  Otherwise you may continue to increase activities without any particular limitations.  Increase the intensity and duration of physical activity gradually.

## 2023-09-11 NOTE — Progress Notes (Signed)
 [address]       [place] [address]             [phone]    HPI: Patient returns for routine postoperative follow-up having undergone CABG x 3 by Dr. Sherene Dilling on 08/16/2023. The patient's early postoperative recovery while in the hospital was notable for orthostatic hypotension requiring support with Midodrine . Since hospital discharge the patient reports he is doing well for the most part.  He does state that he gets very fatigued in the afternoons.  He is ambulating a lot without difficulty.  He is no longer taking pain medication.  Incisions are healing without evidence of infection.  Current Outpatient Medications  Medication Sig Dispense Refill   acetaminophen  (TYLENOL ) 500 MG tablet Take 500 mg by mouth every 6 (six) hours as needed for mild pain (pain score 1-3) or moderate pain (pain score 4-6).     albuterol  (PROAIR  HFA) 108 (90 Base) MCG/ACT inhaler Inhale 2 puffs into the lungs every 6 (six) hours as needed for wheezing or shortness of breath.     Ascorbic Acid  (VITAMIN C ) 1000 MG tablet Take 500 mg by mouth 2 (two) times daily.     aspirin  EC 81 MG tablet Take 1 tablet (81 mg total) by mouth daily. Swallow whole.     Cholecalciferol (VITAMIN D-3) 125 MCG (5000 UT) TABS Take 5,000 Units by mouth See admin instructions. Twice a week- Sunday and Thursday     Coenzyme Q10 100 MG capsule Take 100 mg by mouth 2 (two) times daily.     furosemide  (LASIX ) 40 MG tablet Take 1 tablet (40 mg total) by mouth daily as needed. For weight gain of 3-5 lbs in 24-48 hours 30 tablet 1   Multiple Vitamin (MULTIVITAMIN) tablet Take 1 tablet by mouth daily.     potassium chloride  SA (KLOR-CON  M) 20 MEQ tablet Take 1 tablet (20 mEq total) by mouth daily as needed. Take only on days you take Lasix  30 tablet 1   rosuvastatin  (CRESTOR ) 20 MG tablet Take 1 tablet (20 mg total) by mouth daily. 30 tablet 3   traMADol  (ULTRAM ) 50 MG tablet Take 1 tablet (50 mg total) by mouth every 4  (four) hours as needed for moderate pain (pain score 4-6). 30 tablet 0   TURMERIC PO Take 500 mg by mouth daily at 12 noon.     vitamin E  180 MG (400 UNITS) capsule Take 400 Units by mouth 2 (two) times daily.     No current facility-administered medications for this visit.    Physical Exam:  BP (!) 151/87   Pulse 92   Resp 20   Ht 5' 11.5" (1.816 m)   Wt 184 lb (83.5 kg)   SpO2 96% Comment: RA  BMI 25.31 kg/m   Gen: NAD Heart; RRR Lungs: CTA bilaterally Ext: no edema Incisions: well healed  Diagnostic Tests:  CXR:   FINDINGS: Normal heart size status post sternotomy and CABG. No focal airspace consolidation, pleural effusion, or pneumothorax.   IMPRESSION: No active cardiopulmonary disease.     Electronically Signed   By: Leverne Reading D.O.   On: 09/18/2023 09:08  A/P:  S/P CABG x 3- doing very well... He does note his HR gets high at times and he questions restarting his Toprol  XL.  I think this is fine his HR and BP are elevated today.. I instructed patient that should his BP drop he should hold medication and allow  more time for this to improve Hypotension- discharged on Midodrine , this has been discontinued by Ervin Heath on 1/2 HLD- continue Crestor  Cardiac Rehabilitation- you are cleared and encouraged to enroll and participate at this time Driving- patient given clearance to resume driving Sternal Precautions as discussed RTC prn  Gates Kasal, PA-C [facility] [phone]

## 2023-09-16 ENCOUNTER — Other Ambulatory Visit: Payer: Self-pay | Admitting: Surgery

## 2023-09-16 DIAGNOSIS — I2510 Atherosclerotic heart disease of native coronary artery without angina pectoris: Secondary | ICD-10-CM

## 2023-09-18 ENCOUNTER — Ambulatory Visit
Admission: RE | Admit: 2023-09-18 | Discharge: 2023-09-18 | Disposition: A | Discharge status: Home / Self Care | Payer: Medicare Other | Source: Ambulatory Visit | Attending: Surgery | Admitting: Surgery

## 2023-09-18 ENCOUNTER — Ambulatory Visit (INDEPENDENT_AMBULATORY_CARE_PROVIDER_SITE_OTHER): Payer: Self-pay | Admitting: Physician Assistant

## 2023-09-18 VITALS — BP 151/87 | HR 92 | Resp 20 | Ht 71.5 in | Wt 184.0 lb

## 2023-09-18 DIAGNOSIS — Z951 Presence of aortocoronary bypass graft: Secondary | ICD-10-CM

## 2023-09-18 DIAGNOSIS — I2510 Atherosclerotic heart disease of native coronary artery without angina pectoris: Secondary | ICD-10-CM

## 2023-09-25 ENCOUNTER — Encounter (HOSPITAL_COMMUNITY): Payer: Self-pay

## 2023-09-25 ENCOUNTER — Telehealth (HOSPITAL_COMMUNITY): Payer: Self-pay

## 2023-09-25 NOTE — Telephone Encounter (Signed)
Attempted to call patient in regards to Cardiac Rehab - LM on VM Mailed letter 

## 2023-10-04 ENCOUNTER — Encounter: Payer: Self-pay | Admitting: Physician Assistant

## 2023-10-04 ENCOUNTER — Ambulatory Visit: Payer: Medicare Other | Attending: Physician Assistant | Admitting: Physician Assistant

## 2023-10-04 VITALS — BP 110/72 | HR 91 | Wt 183.0 lb

## 2023-10-04 DIAGNOSIS — I25810 Atherosclerosis of coronary artery bypass graft(s) without angina pectoris: Secondary | ICD-10-CM

## 2023-10-04 DIAGNOSIS — E785 Hyperlipidemia, unspecified: Secondary | ICD-10-CM | POA: Diagnosis present

## 2023-10-04 MED ORDER — METOPROLOL SUCCINATE ER 25 MG PO TB24: 25.0000 mg | ORAL_TABLET | Freq: Every day | ORAL | 3 refills | Status: DC

## 2023-10-04 NOTE — Patient Instructions (Signed)
Medication Instructions:  NO CHANGES *If you need a refill on your cardiac medications before your next appointment, please call your pharmacy*   Lab Work: NO LABS If you have labs (blood work) drawn today and your tests are completely normal, you will receive your results only by: MyChart Message (if you have MyChart) OR A paper copy in the mail If you have any lab test that is abnormal or we need to change your treatment, we will call you to review the results.   Testing/Procedures: NO TESTING   Follow-Up: At [facility], you and your health needs are our priority.  As part of our continuing mission to provide you with exceptional heart care, we have created designated Provider Care Teams.  These Care Teams include your primary Cardiologist (physician) and Advanced Practice Providers (APPs -  Physician Assistants and Nurse Practitioners) who all work together to provide you with the care you need, when you need it.   Your next appointment:   KEEP FOLLOW UP Jan 24 2024  Provider:   Thurmon Fair, MD   Other Instructions

## 2023-10-04 NOTE — Progress Notes (Signed)
Cardiology Office Note:  .   Date:  10/04/2023  ID:  Rickey Lucas, DOB 1955/11/12, MRN [id number] PCP: Tally Joe, MD  [facility] Providers Cardiologist:  Thurmon Fair, MD     History of Present Illness: Rickey Lucas Kitchen   Rickey Lucas is a 68 y.o. male with PMH of CAD s/p CABG and HLD. 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  Patient had a remote episode of syncope in 2019.  He was recently seen by Dr. Royann Shivers in Nov for evaluation of episode of tachycardia and elevated calcium score.  Subsequent coronary CT obtained on 07/26/2023 showed moderate to possibly severe mixed CAD.  FFR suggested significant LAD and PDA disease.  Subsequent cardiac catheterization performed on 08/09/2023 showed 75% mid to distal left main lesion, 70% proximal to mid LAD lesion, 50% RPAV lesion, 30% proximal RCA lesion.  Echocardiogram obtained on the same day showed EF 60 to 65%, no regional wall motion abnormality, normal RV, trivial MR.  CT surgery service was consulted. Pre-CABG Doppler obtained on 08/15/2023 showed nearly normal carotid arteries with normal vertebral arteries.  He was seen by Dr. Laneta Simmers in the office on 08/12/2023 to discuss CABG, unfortunately prior to his surgery, he came back with unstable angina on 08/13/2023.  He ultimately underwent CABG x 3 with LIMA-LAD, SVG-D1 and SVG-OM.  He tolerated the surgery well.  He was able to extubate in the afternoon of the surgery.  He had expected volume overload to require IV diuresis and transition to torsemide afterward.  He was not started on beta-blocker early postoperatively due to marginal blood pressure.  He was able to maintain normal sinus rhythm.  Torsemide was discontinued prior to discharge.  He was discharged on 5 mg twice a day of midodrine.   I saw the patient for posthospital follow-up on 09/05/2023.  His blood pressure was normal at the time, I asked him to stop the midodrine 5 mg twice a day.  Since the last visit, he has been seen by CT  surgery and was doing well.  He was instructed to go back on the Toprol XL 25 mg daily.  His blood pressure has been well-controlled since.  He denies any significant chest discomfort or shortness of breath.  He has went back to the office seen patient 4.5 days a week.  He does feels drained of energy by 2 PM each day.  Overall, he has been doing well.  He has follow-up already scheduled with Dr. Royann Shivers in May.   ROS:   He denies chest pain, palpitations, dyspnea, pnd, orthopnea, n, v, dizziness, syncope, edema, weight gain, or early satiety. All other systems reviewed and are otherwise negative except as noted above.    Studies Reviewed: .        Cardiac Studies & Procedures   CARDIAC CATHETERIZATION  CARDIAC CATHETERIZATION 08/09/2023  Narrative   Mid LM to Dist LM lesion is 75% stenosed.   Prox LAD to Mid LAD lesion is 70% stenosed.   Prox RCA lesion is 30% stenosed.   RPAV lesion is 50% stenosed.  Severe non-calcified distal left main stenosis of 75% Severe calcified proximal LAD stenosis of 70-75% Patent LCx with large, graftable OM Large, dominant RCA with mild nonobstructive plaquing Normal LVEDP  Recommendations: Outpatient cardiac surgical evaluation for CABG. Advised to avoid strenuous activity. Otherwise, continue current medical therapy.  Findings Coronary Findings Diagnostic  Dominance: Right  Left Main Mid LM to Dist LM lesion is 75% stenosed. The lesion  is eccentric.  Left Anterior Descending Prox LAD to Mid LAD lesion is 70% stenosed. The lesion is calcified.  Left Circumflex The vessel exhibits minimal luminal irregularities.  Right Coronary Artery There is mild diffuse disease throughout the vessel. Prox RCA lesion is 30% stenosed.  Right Posterior Atrioventricular Artery RPAV lesion is 50% stenosed.  Intervention  No interventions have been documented.    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 Exam Location:  Outpatient  Procedure: 2D Echo, Color Doppler and Cardiac Doppler  Indications:    CAD of native vessel  History:        Patient has no prior history of Echocardiogram examinations. Risk Factors:Family History of Coronary Artery Disease and Dyslipidemia.  Sonographer:    Delcie Roch RDCS Referring Phys: [phone] MICHAEL COOPER  IMPRESSIONS   1. Left ventricular ejection fraction, by estimation, is 60 to 65%. The left ventricle has normal function. The left ventricle has no regional wall motion abnormalities. Left ventricular diastolic parameters are consistent with Grade I diastolic dysfunction (impaired relaxation). The average left ventricular global longitudinal strain is -18.0 %. The global longitudinal strain is normal. 2. Right ventricular systolic function is normal. The right ventricular size is normal. Tricuspid regurgitation signal is inadequate for assessing PA pressure. 3. The mitral valve is grossly normal. Trivial mitral valve regurgitation. 4. The aortic valve is tricuspid. Aortic valve regurgitation is not visualized. 5. The inferior vena cava is normal in size with greater than 50% respiratory variability, suggesting right atrial pressure of 3 mmHg.  FINDINGS Left Ventricle: Left ventricular ejection fraction, by estimation, is 60 to 65%. The left ventricle has normal function. The left ventricle has no regional wall motion abnormalities. The average left ventricular global longitudinal strain is -18.0 %. The global longitudinal strain is normal. The left ventricular internal cavity size  was normal in size. There is no left ventricular hypertrophy. Left ventricular diastolic parameters are consistent with Grade I diastolic dysfunction (impaired relaxation).  Right Ventricle: The right ventricular size is normal. Right ventricular systolic function is normal. Tricuspid regurgitation signal is inadequate for assessing PA pressure.  Left Atrium: Left atrial size was normal in size.  Right Atrium: Right atrial size was normal in size.  Pericardium: There is no evidence of pericardial effusion.  Mitral Valve: The mitral valve is grossly normal. Trivial mitral valve regurgitation.  Tricuspid Valve: Tricuspid valve regurgitation is not demonstrated.  Aortic Valve: The aortic valve is tricuspid. Aortic valve regurgitation is not visualized.  Pulmonic Valve: Pulmonic valve regurgitation is not visualized.  Aorta: The aortic root and ascending  aorta are structurally normal, with no evidence of dilitation.  Venous: The inferior vena cava is normal in size with greater than 50% respiratory variability, suggesting right atrial pressure of 3 mmHg.  IAS/Shunts: No atrial level shunt detected by color flow Doppler.   LEFT VENTRICLE PLAX 2D LVIDd:         5.10 cm   Diastology LVIDs:         3.40 cm   LV e' medial:    8.16 cm/s LV PW:         1.00 cm   LV E/e' medial:  6.9 LV IVS:        0.70 cm   LV e' lateral:   10.80 cm/s LVOT diam:     1.90 cm   LV E/e' lateral: 5.2 LV SV:         52 LV SV Index:   26        2D Longitudinal Strain LVOT Area:     2.84 cm  2D Strain GLS Avg:     -18.0 %   RIGHT VENTRICLE             IVC RV Basal diam:  2.60 cm     IVC diam: 1.50 cm RV S prime:     11.20 cm/s TAPSE (M-mode): 1.9 cm  LEFT ATRIUM             Index        RIGHT ATRIUM           Index LA diam:        3.20 cm 1.59 cm/m   RA Area:     13.90 cm LA Vol (A2C):   34.6 ml 17.16 ml/m  RA Volume:   33.30 ml  16.52 ml/m LA Vol (A4C):   40.3 ml 19.99 ml/m LA Biplane Vol: 40.9 ml  20.29 ml/m AORTIC VALVE LVOT Vmax:   90.30 cm/s LVOT Vmean:  55.600 cm/s LVOT VTI:    0.183 m  AORTA Ao Root diam: 3.20 cm Ao Asc diam:  2.90 cm  MITRAL VALVE MV Area (PHT): 2.77 cm    SHUNTS MV Decel Time: 274 msec    Systemic VTI:  0.18 m MV E velocity: 56.30 cm/s  Systemic Diam: 1.90 cm MV A velocity: 78.80 cm/s MV E/A ratio:  0.71  [place] signed by Carolan Clines Signature Date/Time: 08/09/2023/5:16:04 PM    Final  TEE  ECHO INTRAOPERATIVE TEE 08/16/2023  Narrative *INTRAOPERATIVE TRANSESOPHAGEAL REPORT *    Patient Name:   DR. Jaysion L Bevans Date of Exam: 08/16/2023 Medical Rec #:  [id number]           Height:       71.0 in Accession #:    [id number]          Weight:       177.2 lb Date of Birth:  02-15-1956            BSA:          2.00 m Patient Age:    67 years            BP: Patient Gender: M                   HR: Exam Location:  Anesthesiology  Transesophogeal exam was perform intraoperatively during surgical procedure. Patient was closely monitored under general anesthesia during the entirety of examination.  Indications:     CABG Performing Phys: Mariann Barter MD Diagnosing  Phys: Roslynn Amble  Complications: No known complications during this procedure. POST-OP IMPRESSIONS _ Left Ventricle: The left ventricular function appears low-normal, consistent with immediate post-bypass state/ventricular pacing. _ Right Ventricle: The right ventricle appears unchanged from pre-bypass, with normal systolic function. _ Aorta: The aorta appears unchanged from pre-bypass. There is no dissection present in the aorta. _ Aortic Valve: The aortic valve appears unchanged from pre-bypass. _ Mitral Valve: The mitral valve appears unchanged from pre-bypass. There is mild regurgitation. _ Tricuspid Valve: The tricuspid valve appears unchanged from pre-bypass. There is mild regurgitation. _ Pulmonic Valve: The pulmonic valve appears unchanged from  pre-bypass. _ Interatrial Septum: The interatrial septum appears unchanged from pre-bypass. _ Pericardium: The pericardium appears unchanged from pre-bypass. No pericardial effusion visualized.  PRE-OP FINDINGS Left Ventricle: The left ventricle has normal systolic function, with an ejection fraction of 55-60%. The cavity size was normal. Left ventricular diastolic function was not evaluated. No significant wall motion abnormality.   Right Ventricle: The right ventricle has normal systolic function. There is no increase in right ventricular wall thickness. Catheter present in the right ventricle.  Left Atrium: Left atrial size was not assessed. No left atrial/left atrial appendage thrombus was detected.  Right Atrium: Right atrial size was not assessed.  Interatrial Septum: No atrial level shunt detected by color flow Doppler.  Pericardium: There is no evidence of pericardial effusion. There is no pleural effusion.  Mitral Valve: The mitral valve is normal in structure. Mitral valve regurgitation is mild by color flow Doppler. There is no evidence of mitral stenosis.  Tricuspid Valve: The tricuspid valve was normal in structure. Tricuspid valve regurgitation was not visualized by color flow Doppler. No evidence of tricuspid stenosis is present.  Aortic Valve: The aortic valve is normal in structure. Aortic valve regurgitation was not visualized by color flow Doppler. There is no stenosis of the aortic valve.  Pulmonic Valve: The pulmonic valve was normal in structure No evidence of pumonic stenosis. Pulmonic valve regurgitation is mild by color flow Doppler.   Aorta: The aortic root and aortic arch are normal in size and structure.  +--------------+--------++ LEFT VENTRICLE         +--------------+--------++ PLAX 2D                +--------------+--------++ LVOT diam:    2.40 cm  +--------------+--------++ LVOT Area:    4.52 cm +--------------+--------++                         +--------------+--------++  +-------------+-----------++ AORTIC VALVE             +-------------+-----------++ AV Vmax:     105.00 cm/s +-------------+-----------++ AV Vmean:    71.367 cm/s +-------------+-----------++ AV VTI:      0.226 m     +-------------+-----------++ AV Peak Grad:4.4 mmHg    +-------------+-----------++ AV Mean Grad:2.3 mmHg    +-------------+-----------++   Roslynn Amble Electronically signed by Roslynn Amble Signature Date/Time: 08/19/2023/7:40:30 PM    Final   CT SCANS  CT CORONARY MORPH W/CTA COR W/SCORE 07/26/2023  Addendum 08/11/2023  7:33 PM ADDENDUM REPORT: 08/11/2023 19:31  EXAM: OVER-READ INTERPRETATION  CT CHEST  The following report is an over-read performed by radiologist Dr. Katheren Puller [facility] on 08/11/2023. This over-read does not include interpretation of cardiac or coronary anatomy or pathology. The coronary CTA interpretation by the cardiologist is attached.  COMPARISON:  02/01/2022  FINDINGS: Cardiovascular: The heart is normal in size.  Mediastinum/Nodes:  Visualized esophagus and trachea within normal limits. No mediastinal lymphadenopathy.  Lungs/Pleura: The lungs are clear bilaterally.  Upper Abdomen: The visualized upper abdomen is within normal limits.  Musculoskeletal: No acute osseous abnormality.  IMPRESSION: No acute or significant intrathoracic abnormality.  Marliss Coots, MD  [facility]  [facility]   Electronically Signed By: Marliss Coots M.D. On: 08/11/2023 19:31  Narrative HISTORY: 68 yo male with chest pain/anginal equiv, high CAD risk, treadmill candidate ANGINA  EXAM: Cardiac/Coronary CTA  TECHNIQUE: The patient was scanned on a [employer].  PROTOCOL: A 120 kV prospective scan was triggered in the descending thoracic aorta at 111 HU's. Axial non-contrast 3 mm  slices were carried out through the heart. The data set was analyzed on a dedicated work station and scored using the Agatson method. Gantry rotation speed was 250 msecs and collimation was .6 mm. Beta blockade and 0.8 mg of sl NTG was given. The 3D data set was reconstructed in 5% intervals of the 35-75 % of the R-R cycle. Diastolic phases were analyzed on a dedicated work station using MPR, MIP and VRT modes. The patient received OMNIPAQUE IOHEXOL 350 MG/ML SOLN contrast.  FINDINGS: Quality: Good, HR 54  Coronary calcium score: The patient's coronary artery calcium score is 468, which places the patient in the 75th percentile [facility]).  Coronary arteries: Normal coronary origins.  Right dominance.  Right Coronary Artery: Dominant. There is minimal mixed 1-24% proximal and distal stenosis. The R-PLB branch is normal. The proximal R-PDA branch has moderate to likely severe stenosis (70-99%) at a 90 degree bend in the vessel.  Left Main Coronary Artery: Normal. Bifurcates into the LAD and LCX arteries.  Left Anterior Descending Coronary Artery: Heavy calcification of the ostial/proximal LAD with at least moderate stenosis (50-69%). Tortuous D1 branch with minimal mixed 1-24% proximal stenosis. Smaller D2 branch without disease.  Left Circumflex Artery: AV groove vessel with ostial/proximal mixed plaque with low attenuation features and (50-69%) mixed stenosis. The distal LCX is normal. Large OM branch with minimal 1-24% proximal non-calcified stenosis.  Aorta: Normal size, 35 mm at the mid ascending aorta (level of the PA bifurcation) measured double oblique. No calcifications. No dissection.  Aortic Valve: Trileaflet. No calcifications.  Other findings:  Normal pulmonary vein drainage into the left atrium.  Normal left atrial appendage without a thrombus.  Normal size of the pulmonary artery.  IMPRESSION: 1. 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 CT  FFR will be performed and reported separately.  2. Coronary artery calcium score is 468, which places the patient in the 75th percentile for age/race and sex-matched control (MESA).  3. Normal coronary origin with right dominance.  4. Aggressive risk factor modification recommended.  Electronically Signed: By: Chrystie Nose M.D. On: 07/29/2023 09:40          Risk Assessment/Calculations:             Physical Exam:   VS:  BP 110/72 (BP Location: Left Arm, Patient Position: Sitting, Cuff Size: Normal)   Pulse 91   Wt 183 lb (83 kg)   SpO2 97%   BMI 25.17 kg/m    Wt Readings from Last 3 Encounters:  10/04/23 183 lb (83 kg)  09/18/23 184 lb (83.5 kg)  09/05/23 179 lb (81.2 kg)    GEN: Well nourished, well developed in no acute distress NECK: No JVD; No carotid bruits CARDIAC: RRR, no murmurs, rubs, gallops RESPIRATORY:  Clear to auscultation  without rales, wheezing or rhonchi  ABDOMEN: Soft, non-tender, non-distended EXTREMITIES:  No edema; No deformity   ASSESSMENT AND PLAN: .     Coronary Artery Disease (CAD) Status post Coronary Artery Bypass Graft (CABG) x3 in December 2024. Recovery progressing as expected with no reported chest discomfort or shortness of breath. Noted tachycardia and elevated blood pressure, likely due to deconditioning and recovery from surgery. -Continue Metoprolol Succinate 25mg  daily. -Check blood pressure and heart rate regularly at home.  Hyperlipidemia: On Crestor.     Cardiac Rehabilitation Eligibility Assessment  The patient is ready to start cardiac rehabilitation from a cardiac standpoint.    Dr. Vear Clock asked today whether he truly needed cardiac rehab.  He has already went back to work and is exercising quite frequently.  If he feels he can keep up with daily exercise, then he may not want cardiac rehab.  Dispo: Follow-up with Dr. Royann Shivers in May  Signed, Azalee Course, [place]

## 2023-10-29 ENCOUNTER — Telehealth (HOSPITAL_COMMUNITY): Payer: Self-pay

## 2023-10-29 NOTE — Telephone Encounter (Signed)
 No response from in regards to Cardiac Rehab  Closed referral

## 2023-12-17 ENCOUNTER — Other Ambulatory Visit: Payer: Self-pay | Admitting: Physician Assistant

## 2023-12-26 ENCOUNTER — Other Ambulatory Visit: Payer: Self-pay | Admitting: Physician Assistant

## 2024-01-16 NOTE — Progress Notes (Unsigned)
 Cardiology Office Note:  .   Date:  01/17/2024  ID:  Rickey Lucas, DOB Apr 05, 1956, MRN [id number] PCP: Rickey Bugler, MD  [facility] Providers Cardiologist:  Luana Rumple, MD    History of Present Illness: Rickey Lucas   Rickey Lucas is a 68 y.o. male with CAD s/p CABG (08/16/2023, [place], LIMA-LAD, SVG-Diag, SVG-OM), hypercholesterolemia,  strong family history of premature onset CAD and cardiac arrest who has had a remote episode of syncope in 2019, recent episodes of abrupt onset tachycardia.  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  He developed mild exertional angina last year and workup identified severe CAD (75% stenosis involving the mid-distal left main and ostial LAD), after which he underwent uncomplicated CABG. He had transient  low BP immediately post op but now back on his beta-blocker.  He returned to work less than 6 weeks after surgery, although initially it was only able to work about 3 hours a day.  He has always been physically active and before surgery was walking 5 miles a day.  He has built back up to roughly 3.5 miles a day since surgery.  One of the warning signs that he coronary disease was affected during more intense parts of his walking routine, when climbing hills, his smart watch showed frequent PVCs and bigeminy.  This has not happened since bypass surgery.  He has been troubled by worsening cough and is less tolerant of pollens and humidity since surgery, but believes this is also getting better.  He has not had problems with orthopnea, PND, palpitations at rest or with activity, dizziness, syncope, lower extremity edema or claudication.  Before CABG, he had a couple of episodes of tachycardia that he described as having very abrupt onset and very abrupt termination. In the past he has been able to terminate these episodes by coughing or by placing his hands in cold water.  The heart rate is around 140 bpm.  1 of these episodes did not resolve  spontaneously and he went to the emergency room 04/20/2023 so went to the emergency room.  While he was waiting to be triaged the arrhythmia abruptly terminated and he remained asymptomatic during his evaluation, which showed benign findings (ECG was normal, normal sinus rhythm, high-sensitivity troponin negative, normal chest x-ray and routine labs).   He has an Scientist, physiological and has not been able to catch any one of the episodes of sustained tachycardia on a rhythm strip, but has shown periods where he has palpitations associated with very frequent PVCs occurring every 3-4 beats.  In 2019 he had an episode of syncope.  That episode was not associated with palpitations.  It happened when he had had poor oral oral intake and had a beer to drink and smoked a cigar and sounded more vasovagal.  He was probably dehydrated at the time and he improved with IV fluids.  It remains the only episode of full-blown syncope he is ever had.  His father had sudden cardiac arrest at age 3 which she survived and subsequently had bypass surgery and received a defibrillator.  He eventually died of mesothelioma many years later.    Before CABG, his cholesterol profile was excellent on statin therapy with cholesterol 117, HDL 49, LDL 58, triglycerides 52.  He does have an elevated lipoprotein LP(a) at 143. In the past he had a borderline hemoglobin A1c at 5.8%, but has never been declared to have full-blown diabetes.  He does not smoke cigarettes.  He has erectile dysfunction which responds well to sildenafil.   Studies Reviewed: .         Risk Assessment/Calculations:             Physical Exam:   VS:  BP 120/74 (BP Location: Left Arm, Patient Position: Sitting, Cuff Size: Normal)   Pulse 70   Ht 5' 11.75" (1.822 m)   Wt 86 kg   SpO2 96%   BMI 25.89 kg/m    Wt Readings from Last 3 Encounters:  01/17/24 86 kg  10/04/23 83 kg  09/18/23 83.5 kg     General: Alert, oriented x3, no distress, appears younger  than stated age Head: no evidence of trauma, PERRL, EOMI, no exophtalmos or lid lag, no myxedema, no xanthelasma; normal ears, nose and oropharynx Neck: normal jugular venous pulsations and no hepatojugular reflux; brisk carotid pulses without delay and no carotid bruits Chest: clear to auscultation, no signs of consolidation by percussion or palpation, normal fremitus, symmetrical and full respiratory excursions Cardiovascular: normal position and quality of the apical impulse, regular rhythm, normal first and second heart sounds, no murmurs, rubs or gallops Abdomen: no tenderness or distention, no masses by palpation, no abnormal pulsatility or arterial bruits, normal bowel sounds, no hepatosplenomegaly Extremities: no clubbing, cyanosis or edema; 2+ radial, ulnar and brachial pulses bilaterally; 2+ right femoral, posterior tibial and dorsalis pedis pulses; 2+ left femoral, posterior tibial and dorsalis pedis pulses; no subclavian or femoral bruits Neurological: grossly nonfocal Psych: Normal mood and affect   ASSESSMENT AND PLAN: .   CAD s/p CABG: good recovery. Asymptomatic.  Already exercising by walking 3.5 miles every day.  His cough appears to be related to reactive airway disease, so I offered switching from metoprolol  to a more selective beta-blocker, but he thinks this is unnecessary at this time. HLP: Excellent lipid profile on statin, LDL 58.  Has an elevated LP (a). Consider switching to PCSK9 inhibitors if the upcoming scientific data from clinical trials appears to support this for reduction in events. ED: Responds well to PDE 5.  On physical exam he has normal distal pulses, no other signs of significant PAD. No carotid disease on preCABG US . 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  He has an [employer] watch.  On metoprolol  succinate 25 mg once daily, but at this point there appears to be no reason to start more aggressive antiarrhythmic therapy, either pharmacological or ablation. 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  However, it has been stable on serial studies, most recently performed in April 2024, with a plan for follow-up in 2 years.       Dispo: Follow-up with a repeat lipid profile and LP(a) in roughly 12 months.  Signed, Luana Rumple, MD

## 2024-01-17 ENCOUNTER — Ambulatory Visit: Payer: Medicare Other | Attending: Cardiovascular Disease | Admitting: Cardiovascular Disease

## 2024-01-17 ENCOUNTER — Encounter: Payer: Self-pay | Admitting: Cardiovascular Disease

## 2024-01-17 VITALS — BP 120/74 | HR 70 | Ht 71.75 in | Wt 189.6 lb

## 2024-01-17 DIAGNOSIS — I2510 Atherosclerotic heart disease of native coronary artery without angina pectoris: Secondary | ICD-10-CM | POA: Diagnosis present

## 2024-01-17 DIAGNOSIS — E7800 Pure hypercholesterolemia, unspecified: Secondary | ICD-10-CM | POA: Diagnosis present

## 2024-01-17 DIAGNOSIS — I471 Supraventricular tachycardia, unspecified: Secondary | ICD-10-CM | POA: Insufficient documentation

## 2024-01-17 DIAGNOSIS — E7841 Elevated Lipoprotein(a): Secondary | ICD-10-CM | POA: Insufficient documentation

## 2024-01-17 MED ORDER — ROSUVASTATIN CALCIUM 20 MG PO TABS: 20.0000 mg | ORAL_TABLET | Freq: Every day | ORAL | 3 refills | Status: AC

## 2024-01-17 MED ORDER — NITROGLYCERIN 0.4 MG SL SUBL: 0.4000 mg | SUBLINGUAL_TABLET | SUBLINGUAL | 3 refills | Status: AC | PRN

## 2024-01-17 MED ORDER — METOPROLOL SUCCINATE ER 50 MG PO TB24: 50.0000 mg | ORAL_TABLET | Freq: Every day | ORAL | 3 refills | Status: AC

## 2024-01-17 NOTE — Patient Instructions (Signed)
 Medication Instructions:  REFILLED Crestor    INCREASED Metoprolol  Succinate to 50 mg daily   START Nitroglycerin  as needed for chest pain.  *If you need a refill on your cardiac medications before your next appointment, please call your pharmacy*  Follow-Up: At [facility], you and your health needs are our priority.  As part of our continuing mission to provide you with exceptional heart care, our providers are all part of one team.  This team includes your primary Cardiologist (physician) and Advanced Practice Providers or APPs (Physician Assistants and Nurse Practitioners) who all work together to provide you with the care you need, when you need it.  Your next appointment:   1 year(s)  Provider:   Luana Rumple, MD

## 2024-01-24 ENCOUNTER — Ambulatory Visit: Payer: Medicare Other | Admitting: Cardiovascular Disease

## 2025-01-22 ENCOUNTER — Ambulatory Visit: Admitting: Cardiovascular Disease
